# Patient Record
Sex: Female | Born: 1955 | Race: White | Hispanic: No | Marital: Single | Smoking: Former smoker
Health system: Southern US, Community
[De-identification: ages and names within clinical notes are randomized; demographics above are authoritative.]

## PROBLEM LIST (undated history)

## (undated) DIAGNOSIS — F329 Major depressive disorder, single episode, unspecified: Secondary | ICD-10-CM

## (undated) DIAGNOSIS — I959 Hypotension, unspecified: Secondary | ICD-10-CM

## (undated) DIAGNOSIS — J069 Acute upper respiratory infection, unspecified: Secondary | ICD-10-CM

## (undated) DIAGNOSIS — L089 Local infection of the skin and subcutaneous tissue, unspecified: Secondary | ICD-10-CM

## (undated) DIAGNOSIS — F419 Anxiety disorder, unspecified: Secondary | ICD-10-CM

## (undated) DIAGNOSIS — J45909 Unspecified asthma, uncomplicated: Secondary | ICD-10-CM

## (undated) DIAGNOSIS — R001 Bradycardia, unspecified: Secondary | ICD-10-CM

## (undated) DIAGNOSIS — M1990 Unspecified osteoarthritis, unspecified site: Secondary | ICD-10-CM

## (undated) DIAGNOSIS — J329 Chronic sinusitis, unspecified: Secondary | ICD-10-CM

## (undated) DIAGNOSIS — K429 Umbilical hernia without obstruction or gangrene: Secondary | ICD-10-CM

## (undated) DIAGNOSIS — J449 Chronic obstructive pulmonary disease, unspecified: Secondary | ICD-10-CM

## (undated) DIAGNOSIS — J209 Acute bronchitis, unspecified: Secondary | ICD-10-CM

## (undated) DIAGNOSIS — L919 Hypertrophic disorder of the skin, unspecified: Secondary | ICD-10-CM

## (undated) DIAGNOSIS — N209 Urinary calculus, unspecified: Secondary | ICD-10-CM

## (undated) DIAGNOSIS — L909 Atrophic disorder of skin, unspecified: Secondary | ICD-10-CM

## (undated) DIAGNOSIS — M549 Dorsalgia, unspecified: Secondary | ICD-10-CM

## (undated) DIAGNOSIS — F3289 Other specified depressive episodes: Secondary | ICD-10-CM

## (undated) DIAGNOSIS — J4489 Other specified chronic obstructive pulmonary disease: Secondary | ICD-10-CM

## (undated) DIAGNOSIS — E785 Hyperlipidemia, unspecified: Secondary | ICD-10-CM

## (undated) DIAGNOSIS — Z131 Encounter for screening for diabetes mellitus: Secondary | ICD-10-CM

## (undated) HISTORY — PX: CHOLECYSTECTOMY: SHX55

## (undated) HISTORY — DX: Hypertrophic disorder of the skin, unspecified: L91.9

## (undated) HISTORY — DX: Acute bronchitis, unspecified: J20.9

## (undated) HISTORY — DX: Acute upper respiratory infection, unspecified: J06.9

## (undated) HISTORY — DX: Dorsalgia, unspecified: M54.9

## (undated) HISTORY — DX: Major depressive disorder, single episode, unspecified: F32.9

## (undated) HISTORY — DX: Other specified depressive episodes: F32.89

## (undated) HISTORY — PX: HERNIA REPAIR: SHX51

## (undated) HISTORY — DX: Encounter for screening for diabetes mellitus: Z13.1

## (undated) HISTORY — DX: Other specified chronic obstructive pulmonary disease: J44.89

## (undated) HISTORY — DX: Hyperlipidemia, unspecified: E78.5

## (undated) HISTORY — PX: ROTATOR CUFF REPAIR: SHX139

## (undated) HISTORY — DX: Umbilical hernia without obstruction or gangrene: K42.9

## (undated) HISTORY — DX: Local infection of the skin and subcutaneous tissue, unspecified: L08.9

## (undated) HISTORY — DX: Chronic sinusitis, unspecified: J32.9

## (undated) HISTORY — DX: Bradycardia, unspecified: R00.1

## (undated) HISTORY — DX: Hypotension, unspecified: I95.9

## (undated) HISTORY — DX: Atrophic disorder of skin, unspecified: L90.9

## (undated) HISTORY — PX: BACK SURGERY: SHX140

## (undated) HISTORY — DX: Urinary calculus, unspecified: N20.9

## (undated) HISTORY — DX: Chronic obstructive pulmonary disease, unspecified: J44.9

## (undated) HISTORY — DX: Unspecified osteoarthritis, unspecified site: M19.90

---

## 2003-05-14 ENCOUNTER — Emergency Department (HOSPITAL_COMMUNITY): Admission: EM | Admit: 2003-05-14 | Discharge: 2003-05-14 | Payer: Self-pay | Admitting: *Deleted

## 2006-09-29 ENCOUNTER — Emergency Department: Payer: Self-pay | Admitting: Emergency Medicine

## 2006-09-30 ENCOUNTER — Other Ambulatory Visit: Payer: Self-pay

## 2007-12-04 ENCOUNTER — Emergency Department: Payer: Self-pay | Admitting: Emergency Medicine

## 2007-12-30 ENCOUNTER — Emergency Department: Payer: Self-pay | Admitting: Emergency Medicine

## 2007-12-30 ENCOUNTER — Other Ambulatory Visit: Payer: Self-pay

## 2008-08-03 ENCOUNTER — Emergency Department: Payer: Self-pay | Admitting: Emergency Medicine

## 2008-11-10 ENCOUNTER — Emergency Department: Payer: Self-pay | Admitting: Emergency Medicine

## 2008-12-01 ENCOUNTER — Ambulatory Visit: Payer: Self-pay | Admitting: Nurse Practitioner

## 2009-03-15 ENCOUNTER — Ambulatory Visit: Payer: Self-pay | Admitting: Nurse Practitioner

## 2009-03-19 ENCOUNTER — Ambulatory Visit: Payer: Self-pay

## 2009-07-10 ENCOUNTER — Ambulatory Visit: Payer: Self-pay | Admitting: Family Medicine

## 2009-07-10 ENCOUNTER — Ambulatory Visit: Payer: Self-pay | Admitting: General Surgery

## 2009-07-21 ENCOUNTER — Emergency Department: Payer: Self-pay | Admitting: Emergency Medicine

## 2009-08-06 ENCOUNTER — Inpatient Hospital Stay: Payer: Self-pay | Admitting: General Surgery

## 2009-09-18 ENCOUNTER — Ambulatory Visit: Payer: Self-pay | Admitting: Unknown Physician Specialty

## 2009-09-20 ENCOUNTER — Ambulatory Visit: Payer: Self-pay | Admitting: Cardiovascular Disease

## 2009-09-20 HISTORY — PX: CARDIAC CATHETERIZATION: SHX172

## 2009-10-19 ENCOUNTER — Ambulatory Visit: Payer: Self-pay | Admitting: Pain Medicine

## 2009-11-06 ENCOUNTER — Ambulatory Visit: Payer: Self-pay | Admitting: Pain Medicine

## 2009-11-19 ENCOUNTER — Ambulatory Visit: Payer: Self-pay | Admitting: Pain Medicine

## 2009-12-17 ENCOUNTER — Ambulatory Visit: Payer: Self-pay | Admitting: Pain Medicine

## 2010-01-25 ENCOUNTER — Ambulatory Visit: Payer: Self-pay | Admitting: Unknown Physician Specialty

## 2010-02-14 ENCOUNTER — Inpatient Hospital Stay: Payer: Self-pay | Admitting: Unknown Physician Specialty

## 2012-09-07 ENCOUNTER — Ambulatory Visit: Payer: Self-pay | Admitting: Cardiovascular Disease

## 2012-09-20 ENCOUNTER — Encounter: Payer: Self-pay | Admitting: Cardiovascular Disease

## 2012-09-20 ENCOUNTER — Ambulatory Visit (INDEPENDENT_AMBULATORY_CARE_PROVIDER_SITE_OTHER): Payer: Medicaid Other | Admitting: Cardiovascular Disease

## 2012-09-20 VITALS — BP 125/84 | HR 72 | Ht 62.0 in | Wt 261.5 lb

## 2012-09-20 DIAGNOSIS — Z87891 Personal history of nicotine dependence: Secondary | ICD-10-CM | POA: Insufficient documentation

## 2012-09-20 DIAGNOSIS — R001 Bradycardia, unspecified: Secondary | ICD-10-CM

## 2012-09-20 DIAGNOSIS — R0602 Shortness of breath: Secondary | ICD-10-CM

## 2012-09-20 DIAGNOSIS — I498 Other specified cardiac arrhythmias: Secondary | ICD-10-CM

## 2012-09-20 DIAGNOSIS — R42 Dizziness and giddiness: Secondary | ICD-10-CM | POA: Insufficient documentation

## 2012-09-20 NOTE — Progress Notes (Signed)
Patient ID: Angelica Zhang, female    DOB: 08/07/1956, 57 y.o.   MRN: [id number]  HPI Comments: Angelica Zhang is a very pleasant 57 year old woman with long history of smoking who quit 5 months ago, prior cardiac catheterization February 2011 showing no significant CAD, history of back surgery/spinal fusion, long history of bradycardia who presents for low heart rate, dizziness.   She reports that for the past 2 years,She has had a low heart rate. After her spinal surgery, she had heart rates in the 50s. She does report having some dizziness when she turns her head too quickly, also when she turns her body too quickly. No dizziness with going from a sitting to standing position or when bending over.  She denies any near syncope or syncope, only dizziness with movements of her head and upper body.  She is proud that she's been able to quit smoking for the past 5 months.she's had difficulty with weight gain.  Prior cardiac catheterization every 2011 showing no significant CAD, ejection fraction 60%  EKG shows normal sinus rhythm with rate 72 beats per minute with no significant ST or T wave changes EKG in December 2010 showing sinus bradycardia with rate 55 beats a minute with no significant ST or T wave changes Leg ultrasound December 2010 showing no evidence of DVT   Outpatient Encounter Prescriptions as of 09/20/2012  Medication Sig Dispense Refill  . albuterol (PROVENTIL HFA;VENTOLIN HFA) 108 (90 BASE) MCG/ACT inhaler Inhale 2 puffs into the lungs every 4 (four) hours as needed.      . cetirizine (ZYRTEC) 10 MG tablet Take 10 mg by mouth daily.      . clonazePAM (KLONOPIN) 1 MG tablet Take 1 mg by mouth 3 (three) times daily as needed for anxiety.      . Fluticasone-Salmeterol (ADVAIR) 250-50 MCG/DOSE AEPB Inhale 1 puff into the lungs every 12 (twelve) hours.      . simvastatin (ZOCOR) 20 MG tablet Take 20 mg by mouth every evening.        Review of Systems  Constitutional: Negative.   HENT:  Negative.   Eyes: Negative.   Respiratory: Negative.   Cardiovascular: Negative.   Gastrointestinal: Negative.   Musculoskeletal: Negative.   Skin: Negative.   Neurological: Positive for dizziness.  Psychiatric/Behavioral: Negative.   All other systems reviewed and are negative.    BP 125/84  Pulse 72  Ht 5\' 2"  (1.575 m)  Wt 261 lb 8 oz (118.616 kg)  BMI 47.82 kg/m2  Physical Exam  Nursing note and vitals reviewed. Constitutional: She is oriented to person, place, and time. She appears well-developed and well-nourished.  obese  HENT:  Head: Normocephalic.  Nose: Nose normal.  Mouth/Throat: Oropharynx is clear and moist.  Eyes: Conjunctivae are normal. Pupils are equal, round, and reactive to light.  Neck: Normal range of motion. Neck supple. No JVD present.  Cardiovascular: Normal rate, regular rhythm, S1 normal, S2 normal, normal heart sounds and intact distal pulses.  Exam reveals no gallop and no friction rub.   No murmur heard. Pulmonary/Chest: Effort normal and breath sounds normal. No respiratory distress. She has no wheezes. She has no rales. She exhibits no tenderness.  Abdominal: Soft. Bowel sounds are normal. She exhibits no distension. There is no tenderness.  Musculoskeletal: Normal range of motion. She exhibits no edema and no tenderness.  Lymphadenopathy:    She has no cervical adenopathy.  Neurological: She is alert and oriented to person, place, and time. Coordination normal.  Skin: Skin is warm and dry. No rash noted. No erythema.  Psychiatric: She has a normal mood and affect. Her behavior is normal. Judgment and thought content normal.    Assessment and Plan

## 2012-09-20 NOTE — Assessment & Plan Note (Signed)
Based on bradycardia at rest. In the office today rate is in the 60s to 70s. No significant symptoms concerning for symptomatic bradycardia requiring a pacemaker. I suggested he closely monitor her at this time though no further testing needed. If she has syncope or near syncope, Holter monitor could be performed.

## 2012-09-20 NOTE — Assessment & Plan Note (Signed)
We have encouraged continued exercise, careful diet management in an effort to lose weight.She has recently started walking with her daughter

## 2012-09-20 NOTE — Assessment & Plan Note (Signed)
Symptoms consistent with vestibular issue. Currently she is fighting a sinus infection. By her history, she has had frequent sinus infections in the past. Less likely secondary to bradycardia.

## 2012-09-20 NOTE — Patient Instructions (Addendum)
You are doing well. No medication changes were made.  Please call us if you have new issues that need to be addressed before your next appt.    

## 2012-09-20 NOTE — Assessment & Plan Note (Signed)
Encouraged continued use of electronic cigarette to avoid smoking.

## 2014-07-29 ENCOUNTER — Emergency Department: Payer: Self-pay | Admitting: Emergency Medicine

## 2014-08-01 LAB — BETA STREP CULTURE(ARMC)

## 2015-05-12 ENCOUNTER — Emergency Department: Payer: Medicaid Other

## 2015-05-12 ENCOUNTER — Emergency Department
Admission: EM | Admit: 2015-05-12 | Discharge: 2015-05-12 | Disposition: A | Discharge status: Home / Self Care | Payer: Medicaid Other | Attending: Emergency Medicine | Admitting: Emergency Medicine

## 2015-05-12 ENCOUNTER — Encounter: Payer: Self-pay | Admitting: Emergency Medicine

## 2015-05-12 DIAGNOSIS — Z79899 Other long term (current) drug therapy: Secondary | ICD-10-CM | POA: Insufficient documentation

## 2015-05-12 DIAGNOSIS — J440 Chronic obstructive pulmonary disease with acute lower respiratory infection: Secondary | ICD-10-CM

## 2015-05-12 DIAGNOSIS — J441 Chronic obstructive pulmonary disease with (acute) exacerbation: Secondary | ICD-10-CM | POA: Diagnosis not present

## 2015-05-12 DIAGNOSIS — Z7951 Long term (current) use of inhaled steroids: Secondary | ICD-10-CM | POA: Diagnosis not present

## 2015-05-12 DIAGNOSIS — I10 Essential (primary) hypertension: Secondary | ICD-10-CM | POA: Insufficient documentation

## 2015-05-12 DIAGNOSIS — Z87891 Personal history of nicotine dependence: Secondary | ICD-10-CM | POA: Diagnosis not present

## 2015-05-12 DIAGNOSIS — J209 Acute bronchitis, unspecified: Secondary | ICD-10-CM

## 2015-05-12 DIAGNOSIS — R0602 Shortness of breath: Secondary | ICD-10-CM | POA: Diagnosis present

## 2015-05-12 LAB — CBC WITH DIFFERENTIAL/PLATELET
Basophils Absolute: 0.1 10*3/uL (ref 0–0.1)
Basophils Relative: 1 %
Eosinophils Absolute: 0 10*3/uL (ref 0–0.7)
Eosinophils Relative: 0 %
HEMATOCRIT: 45.3 % (ref 35.0–47.0)
Hemoglobin: 15.3 g/dL (ref 12.0–16.0)
LYMPHS ABS: 1.5 10*3/uL (ref 1.0–3.6)
LYMPHS PCT: 19 %
MCH: 31.3 pg (ref 26.0–34.0)
MCHC: 33.8 g/dL (ref 32.0–36.0)
MCV: 92.7 fL (ref 80.0–100.0)
MONO ABS: 0.2 10*3/uL (ref 0.2–0.9)
MONOS PCT: 3 %
NEUTROS ABS: 6.2 10*3/uL (ref 1.4–6.5)
Neutrophils Relative %: 77 %
Platelets: 153 10*3/uL (ref 150–440)
RBC: 4.88 MIL/uL (ref 3.80–5.20)
RDW: 13 % (ref 11.5–14.5)
WBC: 8 10*3/uL (ref 3.6–11.0)

## 2015-05-12 LAB — COMPREHENSIVE METABOLIC PANEL
ALBUMIN: 3.9 g/dL (ref 3.5–5.0)
ALT: 37 U/L (ref 14–54)
ANION GAP: 13 (ref 5–15)
AST: 50 U/L — ABNORMAL HIGH (ref 15–41)
Alkaline Phosphatase: 74 U/L (ref 38–126)
BUN: 14 mg/dL (ref 6–20)
CO2: 25 mmol/L (ref 22–32)
Calcium: 8.9 mg/dL (ref 8.9–10.3)
Chloride: 100 mmol/L — ABNORMAL LOW (ref 101–111)
Creatinine, Ser: 0.8 mg/dL (ref 0.44–1.00)
GFR calc Af Amer: >60 mL/min (ref >60)
GFR calc non Af Amer: >60 mL/min (ref >60)
GLUCOSE: 250 mg/dL — AB (ref 65–99)
POTASSIUM: 4.1 mmol/L (ref 3.5–5.1)
SODIUM: 138 mmol/L (ref 135–145)
Total Bilirubin: 0.7 mg/dL (ref 0.3–1.2)
Total Protein: 7.2 g/dL (ref 6.5–8.1)

## 2015-05-12 LAB — LACTIC ACID, PLASMA: Lactic Acid, Venous: 3.7 mmol/L (ref 0.5–2.0)

## 2015-05-12 LAB — URINALYSIS COMPLETE WITH MICROSCOPIC (ARMC ONLY)
BACTERIA UA: NONE SEEN
Bilirubin Urine: NEGATIVE
Glucose, UA: 50 mg/dL — AB
Hgb urine dipstick: NEGATIVE
Ketones, ur: NEGATIVE mg/dL
Leukocytes, UA: NEGATIVE
NITRITE: NEGATIVE
PROTEIN: NEGATIVE mg/dL
SPECIFIC GRAVITY, URINE: 1.012 (ref 1.005–1.030)
pH: 6 (ref 5.0–8.0)

## 2015-05-12 LAB — TROPONIN I: Troponin I: <0.03 ng/mL (ref <0.031)

## 2015-05-12 LAB — RAPID INFLUENZA A&B ANTIGENS (ARMC ONLY): INFLUENZA B (ARMC): NEGATIVE

## 2015-05-12 LAB — FIBRIN DERIVATIVES D-DIMER (ARMC ONLY): FIBRIN DERIVATIVES D-DIMER (ARMC): 299 (ref 0–499)

## 2015-05-12 LAB — RAPID INFLUENZA A&B ANTIGENS: Influenza A (ARMC): NEGATIVE

## 2015-05-12 MED ORDER — PREDNISONE 20 MG PO TABS
60.0000 mg | ORAL_TABLET | Freq: Once | ORAL | Status: AC
  Administered 2015-05-12: 60 mg via ORAL
  Filled 2015-05-12: qty 3

## 2015-05-12 MED ORDER — IPRATROPIUM-ALBUTEROL 0.5-2.5 (3) MG/3ML IN SOLN
3.0000 mL | Freq: Once | RESPIRATORY_TRACT | Status: AC
  Administered 2015-05-12: 3 mL via RESPIRATORY_TRACT
  Filled 2015-05-12: qty 3

## 2015-05-12 MED ORDER — NICOTINE 21 MG/24HR TD PT24: 21.0000 mg | MEDICATED_PATCH | Freq: Every day | TRANSDERMAL | Status: DC

## 2015-05-12 MED ORDER — PREDNISONE 20 MG PO TABS
40.0000 mg | ORAL_TABLET | Freq: Every day | ORAL | Status: AC
Start: 2015-05-13 — End: 2016-05-17

## 2015-05-12 NOTE — ED Notes (Signed)
MD at bedside to assess patient.

## 2015-05-12 NOTE — Discharge Instructions (Signed)
Chronic Obstructive Pulmonary Disease  Chronic obstructive pulmonary disease (COPD) is a common lung condition in which airflow from the lungs is limited. COPD is a general term that can be used to describe many different lung problems that limit airflow, including both chronic bronchitis and emphysema. If you have COPD, your lung function will probably never return to normal, but there are measures you can take to improve lung function and make yourself feel better.   CAUSES    Smoking (common).    Exposure to secondhand smoke.    Genetic problems.   Chronic inflammatory lung diseases or recurrent infections.  SYMPTOMS    Shortness of breath, especially with physical activity.    Deep, persistent (chronic) cough with a large amount of thick mucus.    Wheezing.    Rapid breaths (tachypnea).    Gray or bluish discoloration (cyanosis) of the skin, especially in fingers, toes, or lips.    Fatigue.    Weight loss.    Frequent infections or episodes when breathing symptoms become much worse (exacerbations).    Chest tightness.  DIAGNOSIS   Your health care provider will take a medical history and perform a physical examination to make the initial diagnosis. Additional tests for COPD may include:    Lung (pulmonary) function tests.   Chest X-ray.   CT scan.   Blood tests.  TREATMENT   Treatment available to help you feel better when you have COPD includes:    Inhaler and nebulizer medicines. These help manage the symptoms of COPD and make your breathing more comfortable.   Supplemental oxygen. Supplemental oxygen is only helpful if you have a low oxygen level in your blood.    Exercise and physical activity. These are beneficial for nearly all people with COPD. Some people may also benefit from a pulmonary rehabilitation program.  HOME CARE INSTRUCTIONS    Take all medicines (inhaled or pills) as directed by your health care provider.   Avoid over-the-counter medicines or cough syrups  that dry up your airway (such as antihistamines) and slow down the elimination of secretions unless instructed otherwise by your health care provider.    If you are a smoker, the most important thing that you can do is stop smoking. Continuing to smoke will cause further lung damage and breathing trouble. Ask your health care provider for help with quitting smoking. He or she can direct you to community resources or hospitals that provide support.   Avoid exposure to irritants such as smoke, chemicals, and fumes that aggravate your breathing.   Use oxygen therapy and pulmonary rehabilitation if directed by your health care provider. If you require home oxygen therapy, ask your health care provider whether you should purchase a pulse oximeter to measure your oxygen level at home.    Avoid contact with individuals who have a contagious illness.   Avoid extreme temperature and humidity changes.   Eat healthy foods. Eating smaller, more frequent meals and resting before meals may help you maintain your strength.   Stay active, but balance activity with periods of rest. Exercise and physical activity will help you maintain your ability to do things you want to do.   Preventing infection and hospitalization is very important when you have COPD. Make sure to receive all the vaccines your health care provider recommends, especially the pneumococcal and influenza vaccines. Ask your health care provider whether you need a pneumonia vaccine.   Learn and use relaxation techniques to manage stress.   Learn   going to whistle and breathe out (exhale) through the pursed lips for 2 seconds.   Diaphragmatic breathing. Start by putting one hand on your abdomen just above  your waist. Inhale slowly through your nose. The hand on your abdomen should move out. Then purse your lips and exhale slowly. You should be able to feel the hand on your abdomen moving in as you exhale.   Learn and use controlled coughing to clear mucus from your lungs. Controlled coughing is a series of short, progressive coughs. The steps of controlled coughing are:  1. Lean your head slightly forward.  2. Breathe in deeply using diaphragmatic breathing.  3. Try to hold your breath for 3 seconds.  4. Keep your mouth slightly open while coughing twice.  5. Spit any mucus out into a tissue.  6. Rest and repeat the steps once or twice as needed. SEEK MEDICAL CARE IF:   You are coughing up more mucus than usual.   There is a change in the color or thickness of your mucus.   Your breathing is more labored than usual.   Your breathing is faster than usual.  SEEK IMMEDIATE MEDICAL CARE IF:   You have shortness of breath while you are resting.   You have shortness of breath that prevents you from:  Being able to talk.   Performing your usual physical activities.   You have chest pain lasting longer than 5 minutes.   Your skin color is more cyanotic than usual.  You measure low oxygen saturations for longer than 5 minutes with a pulse oximeter. MAKE SURE YOU:   Understand these instructions.  Will watch your condition.  Will get help right away if you are not doing well or get worse. Document Released: 05/07/2005 Document Revised: 12/12/2013 Document Reviewed: 03/24/2013 [facility] Patient Information 2015 [place], [place]. This information is not intended to replace advice given to you by your health care provider. Make sure you discuss any questions you have with your health care provider.  Please return immediately if condition worsens. Please contact her primary physician or the physician you were given for referral. If you have any specialist physicians involved  in her treatment and plan please also contact them. Thank you for using [facility]. Please continue your amoxicillin and we're going to boost your prednisone over the next 5 days. Over-the-counter Afrin nasal spray alternating nasal passages. Drink plenty of fluids and continue your home nebulizers as needed. He can give yourself breathing treatments every 2-4 hours as needed. Contact your primary physician on Monday for follow-up as soon as possible.

## 2015-05-12 NOTE — ED Notes (Signed)
Patient transported to X-ray 

## 2015-05-12 NOTE — ED Provider Notes (Signed)
Time Seen: Approximately ----------------------------------------- 5:50 PM on 05/12/2015 -----------------------------------------   I have reviewed the triage notes  Chief Complaint: Shortness of Breath   History of Present Illness: Angelica Zhang is a 59 y.o. female who presents with some progressive shortness of breath over the past month. She states she has seen a PA with her primary physician was prescribed prednisone and amoxicillin. She states she's done with the amoxicillin and takes the prednisone until Monday. She states she doesn't feel any improvement. She's had a lot of sinus drainage with a dry nonproductive cough. She states she's coughed hard enough to vomit but no persistent nausea or vomiting. She denies any loose stool or diarrhea. She is not aware of any high fevers at home and describes it is low grade approximately 99 was seen at the office. She states that she has some chronic peripheral edema but is noticed some slightly more swelling than usual since been on the steroids. She denies any dysuria, hematuria, urinary frequency. She denies any chest pain, arm pain, or jaw pain. She states is been a lot of illness with her grandkids with strep throat, sinus infection, etc.   Past Medical History  Diagnosis Date  . Sinusitis   . Bradycardia   . Urinary calculus, unspecified   . Acute upper respiratory infections of unspecified site   . Backache, unspecified   . Acute bronchitis   . Depressive disorder, not elsewhere classified   . Hypotension, unspecified   . Unspecified local infection of skin and subcutaneous tissue   . Screening for diabetes mellitus   . Unspecified hypertrophic and atrophic condition of skin   . Osteoarthrosis, unspecified whether generalized or localized, other specified sites   . Umbilical hernia without mention of obstruction or gangrene   . Chronic airway obstruction, not elsewhere classified   . Other and unspecified hyperlipidemia      Patient Active Problem List   Diagnosis Date Noted  . Bradycardia 09/20/2012  . Dizziness 09/20/2012  . Morbid obesity 09/20/2012  . Smoking history 09/20/2012    Past Surgical History  Procedure Laterality Date  . Back surgery    . Cardiac catheterization  09/20/2009    [facility];[place]  . Hernia repair      x 2  . Cholecystectomy    . Rotator cuff repair      Past Surgical History  Procedure Laterality Date  . Back surgery    . Cardiac catheterization  09/20/2009    [facility]  . Hernia repair      x 2  . Cholecystectomy    . Rotator cuff repair      Current Outpatient Rx  Name  Route  Sig  Dispense  Refill  . albuterol (PROVENTIL HFA;VENTOLIN HFA) 108 (90 BASE) MCG/ACT inhaler   Inhalation   Inhale 2 puffs into the lungs every 4 (four) hours as needed.         . cetirizine (ZYRTEC) 10 MG tablet   Oral   Take 10 mg by mouth daily.         . clonazePAM (KLONOPIN) 1 MG tablet   Oral   Take 1 mg by mouth 3 (three) times daily as needed for anxiety.         . Fluticasone-Salmeterol (ADVAIR) 250-50 MCG/DOSE AEPB   Inhalation   Inhale 1 puff into the lungs every 12 (twelve) hours.         . simvastatin (ZOCOR) 20 MG tablet   Oral   Take  20 mg by mouth every evening.           Allergies:  Review of patient's allergies indicates no known allergies.  Family History: Family History  Problem Relation Age of Onset  . Heart attack Mother     Social History: Social History  Substance Use Topics  . Smoking status: Former Smoker -- 1.00 packs/day for 40 years    Types: Cigarettes  . Smokeless tobacco: None  . Alcohol Use: No     Review of Systems:   10 point review of systems was performed and was otherwise negative:  Constitutional: No fever Eyes: No visual disturbances ENT: No sore throat, ear pain Cardiac: No chest pain Respiratory: No shortness of breath, wheezing, or stridor Abdomen: No abdominal pain, no vomiting, No  diarrhea Endocrine: No weight loss, No night sweats Extremities: No peripheral edema, cyanosis Skin: No rashes, easy bruising Neurologic: No focal weakness, trouble with speech or swollowing Urologic: No dysuria, Hematuria, or urinary frequency   Physical Exam:  ED Triage Vitals  Enc Vitals Group     BP 05/12/15 1659 115/60 mmHg     Pulse Rate 05/12/15 1659 60     Resp 05/12/15 1659 20     Temp 05/12/15 1659 98 F (36.7 C)     Temp Source 05/12/15 1659 Oral     SpO2 05/12/15 1659 89 %     Weight 05/12/15 1659 262 lb (118.842 kg)     Height 05/12/15 1659 5\' 2"  (1.575 m)     Head Cir --      Peak Flow --      Pain Score --      Pain Loc --      Pain Edu? --      Excl. in [place]? --     General: Awake , Alert , and Oriented times 3; GCS 15 Head: Normal cephalic , atraumatic Eyes: Pupils equal , round, reactive to light Nose/Throat: No nasal drainage, patent upper airway without erythema or exudate.  Neck: Supple, Full range of motion, No anterior adenopathy or palpable thyroid masses Lungs: Lung sounds show minimal air movement at the bases with some end expiratory wheezing heard in both upper lung fields. No rhonchi or rales are noted. Heart: Regular rate, regular rhythm without murmurs , gallops , or rubs Abdomen: Soft, non tender without rebound, guarding , or rigidity; bowel sounds positive and symmetric in all 4 quadrants. No organomegaly .        Extremities: 2 plus symmetric pulses. Mild nonpitting edema, clubbing or cyanosis Neurologic: normal ambulation, Motor symmetric without deficits, sensory intact Skin: warm, dry, no rashes   Labs:   All laboratory work was reviewed including any pertinent negatives or positives listed below:  Labs Reviewed  CULTURE, BLOOD (ROUTINE X 2)  CULTURE, BLOOD (ROUTINE X 2)  URINE CULTURE  INFLUENZA A&B ANTIGENS ([facility] ONLY)  COMPREHENSIVE METABOLIC PANEL  CBC WITH DIFFERENTIAL/PLATELET  LACTIC ACID, PLASMA  LACTIC ACID, PLASMA   URINALYSIS COMPLETEWITH MICROSCOPIC ([facility] ONLY)  FIBRIN DERIVATIVES D-DIMER ([facility] ONLY)  TROPONIN I    EKG: * ED ECG REPORT I, Daymon Larsen, the attending physician, personally viewed and interpreted this ECG.  Date: 05/12/2015 EKG Time: 1709 Rate: 58 Rhythm: normal sinus rhythm QRS Axis: normal Intervals: normal ST/T Wave abnormalities: normal Conduction Disutrbances: none Narrative Interpretation: unremarkable    Radiology:  EXAM: CHEST 2 VIEW  COMPARISON: 02/15/2010  FINDINGS: COPD with hyperinflation and prominent lung markings. Negative for pneumonia or heart  failure. No effusion.  IMPRESSION: COPD without acute abnormality.    I personally reviewed the radiologic studies     ED Course: Patient received 2 DuoNeb here in emergency department and was reexamined. Her pulse oximetry stabilizes anywhere from 91-94% on room air. He does have a home nebulization therapy available and she's been advised to continue that every 2-4 hours. He was advised to close out the amoxicillin and I boosted her prednisone over the next 5 days. She most likely has some form of acute bronchitis with bronchospasm. There is no evidence clinically or on x-ray evaluation of community-acquired pneumonia at this point but advised her that the amoxicillin was not hurting her at this point and close out the prescription. She's been advised to use over-the-counter Afrin nasal spray on occasion for her nasal congestion and to proceed with easier tech that was recommended by the PA for seasonal allergies. The bronchitis is most likely allergy related based on the length of time, she is afebrile, and testing here was negative for influenza and bacterial causes.   Assessment Acute bronchitis with bronchospasm Tobacco abuse COPD     Plan:  Patient was advised to return immediately if condition worsens. Patient was advised to follow up with her primary care physician or other specialized  physicians involved and in their current assessment.             Daymon Larsen, MD 05/12/15 2255

## 2015-05-12 NOTE — ED Notes (Signed)
Patient states her symptoms started >1 month ago. Went to her primary care doctor and was put on prednisone and amoxicillin. Patient states her symptoms have gradually worsened over the past week.

## 2015-05-14 LAB — URINE CULTURE

## 2015-05-17 LAB — CULTURE, BLOOD (ROUTINE X 2)
CULTURE: NO GROWTH
Culture: NO GROWTH

## 2016-03-19 ENCOUNTER — Emergency Department: Payer: Medicaid Other

## 2016-03-19 ENCOUNTER — Encounter: Payer: Self-pay | Admitting: *Deleted

## 2016-03-19 DIAGNOSIS — Z7952 Long term (current) use of systemic steroids: Secondary | ICD-10-CM | POA: Insufficient documentation

## 2016-03-19 DIAGNOSIS — Z87891 Personal history of nicotine dependence: Secondary | ICD-10-CM | POA: Diagnosis not present

## 2016-03-19 DIAGNOSIS — Z792 Long term (current) use of antibiotics: Secondary | ICD-10-CM | POA: Diagnosis not present

## 2016-03-19 DIAGNOSIS — K5900 Constipation, unspecified: Secondary | ICD-10-CM | POA: Insufficient documentation

## 2016-03-19 DIAGNOSIS — Z79899 Other long term (current) drug therapy: Secondary | ICD-10-CM | POA: Insufficient documentation

## 2016-03-19 DIAGNOSIS — R1084 Generalized abdominal pain: Secondary | ICD-10-CM | POA: Diagnosis present

## 2016-03-19 DIAGNOSIS — R0602 Shortness of breath: Secondary | ICD-10-CM | POA: Diagnosis not present

## 2016-03-19 LAB — BASIC METABOLIC PANEL
ANION GAP: 7 (ref 5–15)
BUN: 12 mg/dL (ref 6–20)
CHLORIDE: 107 mmol/L (ref 101–111)
CO2: 26 mmol/L (ref 22–32)
Calcium: 9.4 mg/dL (ref 8.9–10.3)
Creatinine, Ser: 0.76 mg/dL (ref 0.44–1.00)
GFR calc Af Amer: >60 mL/min (ref >60)
Glucose, Bld: 91 mg/dL (ref 65–99)
POTASSIUM: 4 mmol/L (ref 3.5–5.1)
SODIUM: 140 mmol/L (ref 135–145)

## 2016-03-19 LAB — CBC
HEMATOCRIT: 45.3 % (ref 35.0–47.0)
HEMOGLOBIN: 15.7 g/dL (ref 12.0–16.0)
MCH: 31.9 pg (ref 26.0–34.0)
MCHC: 34.7 g/dL (ref 32.0–36.0)
MCV: 91.8 fL (ref 80.0–100.0)
Platelets: 182 10*3/uL (ref 150–440)
RBC: 4.93 MIL/uL (ref 3.80–5.20)
RDW: 13 % (ref 11.5–14.5)
WBC: 9.2 10*3/uL (ref 3.6–11.0)

## 2016-03-19 LAB — TROPONIN I: Troponin I: <0.03 ng/mL (ref <0.03)

## 2016-03-19 MED ORDER — ALBUTEROL SULFATE (2.5 MG/3ML) 0.083% IN NEBU: 5.0000 mg | INHALATION_SOLUTION | Freq: Once | RESPIRATORY_TRACT | Status: DC

## 2016-03-19 NOTE — ED Triage Notes (Addendum)
Pt reports she has sob for 2 days.  Pt reports tingling in chest area.  No n/v/d.  Pt reports feeling constipated.  Last bm was today.  Hx copd.   Pt alert.  Speech clear.   Pt drinking water in triage.

## 2016-03-20 ENCOUNTER — Emergency Department
Admission: EM | Admit: 2016-03-20 | Discharge: 2016-03-20 | Disposition: A | Discharge status: Home / Self Care | Payer: Medicaid Other | Attending: Emergency Medicine | Admitting: Emergency Medicine

## 2016-03-20 ENCOUNTER — Emergency Department: Payer: Medicaid Other

## 2016-03-20 DIAGNOSIS — R1084 Generalized abdominal pain: Secondary | ICD-10-CM

## 2016-03-20 DIAGNOSIS — K5900 Constipation, unspecified: Secondary | ICD-10-CM

## 2016-03-20 LAB — URINALYSIS COMPLETE WITH MICROSCOPIC (ARMC ONLY)
BILIRUBIN URINE: NEGATIVE
Glucose, UA: NEGATIVE mg/dL
Hgb urine dipstick: NEGATIVE
KETONES UR: NEGATIVE mg/dL
LEUKOCYTES UA: NEGATIVE
NITRITE: NEGATIVE
PH: 5 (ref 5.0–8.0)
Protein, ur: NEGATIVE mg/dL
RBC / HPF: NONE SEEN RBC/hpf (ref 0–5)
SPECIFIC GRAVITY, URINE: 1.012 (ref 1.005–1.030)
WBC, UA: NONE SEEN WBC/hpf (ref 0–5)

## 2016-03-20 MED ORDER — IOPAMIDOL (ISOVUE-300) INJECTION 61%
100.0000 mL | Freq: Once | INTRAVENOUS | Status: AC | PRN
  Administered 2016-03-20: 100 mL via INTRAVENOUS

## 2016-03-20 MED ORDER — DIATRIZOATE MEGLUMINE & SODIUM 66-10 % PO SOLN
15.0000 mL | Freq: Once | ORAL | Status: AC
  Administered 2016-03-20: 15 mL via ORAL

## 2016-03-20 NOTE — ED Notes (Signed)
Discharge instructions reviewed with patient. Patient verbalized understanding. Patient ambulated to lobby without difficulty.   

## 2016-03-20 NOTE — ED Provider Notes (Signed)
[facility] Emergency Department Provider Note  ____________________________________________   First MD Initiated Contact with Patient 03/20/16 [phone]     (approximate)  I have reviewed the triage vital signs and the nursing notes.   HISTORY  Chief Complaint Shortness of Breath    HPI Angelica Zhang is a 60 y.o. female who presented to triage with complaints of shortness of breath 2 days and "tingling" in the chest area.  However for me she states that she did feel slightly short of breath yesterday but that is completely resolved that she wants to discuss the generalized abdominal pain that she has had for several weeks.  She reports that she has lost 40 pounds over the last several months and that she stop smoking.  Since then, however, she has found that her stools are very thin and that she goes less frequently.  She did have a normal bowel movement today.  She reports mild generalized abdominal pain that comes and goes.  Nothing in particular makes it better nor worse.  She denies vomiting does have some occasional nausea.  She is concerned because her father died of liver disease and he was not an alcoholic.  She denies blood in her stool.  She denies fever/chills, chest pain, dysuria.   Past Medical History:  Diagnosis Date  . Acute bronchitis   . Acute upper respiratory infections of unspecified site   . Backache, unspecified   . Bradycardia   . Chronic airway obstruction, not elsewhere classified   . Depressive disorder, not elsewhere classified   . Hypotension, unspecified   . Osteoarthrosis, unspecified whether generalized or localized, other specified sites   . Other and unspecified hyperlipidemia   . Screening for diabetes mellitus   . Sinusitis   . Umbilical hernia without mention of obstruction or gangrene   . Unspecified hypertrophic and atrophic condition of skin   . Unspecified local infection of skin and subcutaneous tissue   . Urinary  calculus, unspecified     Patient Active Problem List   Diagnosis Date Noted  . Bradycardia 09/20/2012  . Dizziness 09/20/2012  . Morbid obesity ([place]) 09/20/2012  . Smoking history 09/20/2012    Past Surgical History:  Procedure Laterality Date  . BACK SURGERY    . CARDIAC CATHETERIZATION  09/20/2009   [facility];[place]  . CHOLECYSTECTOMY    . HERNIA REPAIR     x 2  . ROTATOR CUFF REPAIR      Prior to Admission medications   Medication Sig Start Date End Date Taking? Authorizing Provider  ADVAIR HFA 230-21 MCG/ACT inhaler Inhale 2 puffs into the lungs 2 (two) times daily. 04/12/15   Historical Provider, MD  albuterol (PROVENTIL HFA;VENTOLIN HFA) 108 (90 BASE) MCG/ACT inhaler Inhale 2 puffs into the lungs every 4 (four) hours as needed for wheezing or shortness of breath.     Historical Provider, MD  albuterol (PROVENTIL) (2.5 MG/3ML) 0.083% nebulizer solution Take 3 mLs by nebulization every 4 (four) hours as needed. For shortness of breath and wheezing. 04/12/15   Historical Provider, MD  ALPRAZolam Duanne Moron) 1 MG tablet Take 1 mg by mouth 3 (three) times daily as needed. For anxiety 03/17/15   Historical Provider, MD  amoxicillin (AMOXIL) 875 MG tablet Take 875 mg by mouth 2 (two) times daily. for 10 days 05/07/15   Historical Provider, MD  ATROVENT HFA 17 MCG/ACT inhaler Inhale 2 puffs into the lungs 4 (four) times daily. For COPD. 04/12/15   Historical Provider, MD  Fluticasone-Salmeterol (ADVAIR) 250-50 MCG/DOSE AEPB Inhale 1 puff into the lungs every 12 (twelve) hours.    Historical Provider, MD  montelukast (SINGULAIR) 10 MG tablet Take 10 mg by mouth at bedtime. 03/17/15   Historical Provider, MD  nicotine (NICODERM CQ - DOSED IN MG/24 HOURS) 21 mg/24hr patch Place 1 patch (21 mg total) onto the skin daily. 05/12/15   Daymon Larsen, MD  predniSONE (DELTASONE) 20 MG tablet Take 2 tablets (40 mg total) by mouth daily. 05/13/15 05/17/16  Daymon Larsen, MD    Allergies Review of patient's  allergies indicates no known allergies.  Family History  Problem Relation Age of Onset  . Heart attack Mother     Social History Social History  Substance Use Topics  . Smoking status: Former Smoker    Packs/day: 1.00    Years: 40.00    Types: Cigarettes  . Smokeless tobacco: Not on file  . Alcohol use No    Review of Systems Constitutional: No fever/chills Eyes: No visual changes. ENT: No sore throat. Cardiovascular: Denies chest pain. Respiratory: shortness of breath As today.  Did not use her albuterol. Gastrointestinal: Intermittent generalized abdominal pain.  nausea, no vomiting.  No diarrhea.  +constipation. Genitourinary: Negative for dysuria. Musculoskeletal: Negative for back pain. Skin: Negative for rash. Neurological: Negative for headaches, focal weakness or numbness.  10-point ROS otherwise negative.  ____________________________________________   PHYSICAL EXAM:  VITAL SIGNS: ED Triage Vitals  Enc Vitals Group     BP 03/19/16 2202 112/84     Pulse Rate 03/19/16 2202 (!) 50     Resp 03/19/16 2202 20     Temp 03/19/16 2202 98 F (36.7 C)     Temp src --      SpO2 03/19/16 2202 96 %     Weight 03/19/16 2202 230 lb (104.3 kg)     Height 03/19/16 2202 5\' 2"  (1.575 m)     Head Circumference --      Peak Flow --      Pain Score 03/19/16 2204 2     Pain Loc --      Pain Edu? --      Excl. in [place]? --     Constitutional: Alert and oriented. Well appearing and in no acute distress. Eyes: Conjunctivae are normal. PERRL. EOMI. Head: Atraumatic. Nose: No congestion/rhinnorhea. Mouth/Throat: Mucous membranes are moist.  Oropharynx non-erythematous. Neck: No stridor.  No meningeal signs.   Cardiovascular: Normal rate, regular rhythm. Good peripheral circulation. Grossly normal heart sounds.   Respiratory: Normal respiratory effort.  No retractions. Lungs CTAB. Gastrointestinal: Soft, Obese, mild diffuse abdominal tenderness throughout.   Musculoskeletal:  No lower extremity tenderness nor edema. No gross deformities of extremities. Neurologic:  Normal speech and language. No gross focal neurologic deficits are appreciated.  Skin:  Skin is warm, dry and intact. No rash noted. Psychiatric: Mood and affect are normal. Speech and behavior are normal.  ____________________________________________   LABS (all labs ordered are listed, but only abnormal results are displayed)  Labs Reviewed  URINALYSIS COMPLETEWITH MICROSCOPIC ([facility] ONLY) - Abnormal; Notable for the following:       Result Value   Color, Urine YELLOW (*)    APPearance CLEAR (*)    Bacteria, UA RARE (*)    Squamous Epithelial / LPF 0-5 (*)    All other components within normal limits  BASIC METABOLIC PANEL  CBC  TROPONIN I   ____________________________________________  EKG  ED ECG REPORT I, Brelee Renk, the  attending physician, personally viewed and interpreted this ECG.  Date: 03/19/2016 EKG Time: 21:58 Rate: 51 Rhythm: sinus bradycardia QRS Axis: normal Intervals: normal ST/T Wave abnormalities: normal Conduction Disturbances: none Narrative Interpretation: unremarkable  ____________________________________________  RADIOLOGY   Dg Chest 2 View  Result Date: 03/19/2016 CLINICAL DATA:  Pt reports she has sob for 2 days. Pt reports tingling in chest area. No n/v/d. Pt reports feeling constipated. Last BM was today. Hx copd 05/12/2015 EXAM: CHEST  2 VIEW COMPARISON:  05/12/2015 FINDINGS: Lungs are mildly hyperinflated. Heart is normal in size. There are no focal consolidations. No pleural effusions or pulmonary edema. IMPRESSION: No evidence for acute  abnormality. Electronically Signed   By: Nolon Nations M.D.   On: 03/19/2016 22:40   Ct Abdomen Pelvis W Contrast  Result Date: 03/20/2016 CLINICAL DATA:  Acute onset of shortness of breath and chest tingling. Constipation. Initial encounter. EXAM: CT ABDOMEN AND PELVIS WITH CONTRAST TECHNIQUE: Multidetector  CT imaging of the abdomen and pelvis was performed using the standard protocol following bolus administration of intravenous contrast. CONTRAST:  117mL ISOVUE-300 IOPAMIDOL (ISOVUE-300) INJECTION 61% COMPARISON:  CT of the abdomen and pelvis performed 03/19/2009 FINDINGS: The visualized lung bases are clear. The liver and spleen are unremarkable in appearance. The patient is status post cholecystectomy, with clips noted at the gallbladder fossa. The pancreas and adrenal glands are unremarkable. A 2.3 cm left renal cyst is noted. There is no evidence of hydronephrosis. No renal or ureteral stones are seen. No perinephric stranding is appreciated. No free fluid is identified. The small bowel is unremarkable in appearance. The stomach is within normal limits. No acute vascular abnormalities are seen. Minimal calcification is seen along the distal abdominal aorta and its branches. The appendix is normal in caliber, without evidence of appendicitis. The colon is grossly unremarkable in appearance. The bladder is mildly distended and grossly unremarkable. The uterus is unremarkable in appearance. The ovaries are relatively symmetric. No suspicious adnexal masses are seen. No inguinal lymphadenopathy is seen. No acute osseous abnormalities are identified. Lumbosacral spinal fusion hardware is noted at L4-S1, with underlying decompression. There is mild grade 1 anterolisthesis of L4 on L5. IMPRESSION: 1. No acute abnormality seen within the abdomen or pelvis. 2. Small left renal cyst noted. Electronically Signed   By: Garald Balding M.D.   On: 03/20/2016 02:05    ____________________________________________   PROCEDURES  Procedure(s) performed:   Procedures   Critical Care performed: No ____________________________________________   INITIAL IMPRESSION / ASSESSMENT AND PLAN / ED COURSE  Pertinent labs & imaging results that were available during my care of the patient were reviewed by me and considered in  my medical decision making (see chart for details).  Patient's labs are unremarkable and her lungs are clear to auscultation.  She has a history of mild COPD although she has stopped smoking.  Her chest x-ray is reassuring.  I explained to her that based on her history and symptoms I find it very unlikely that she has an acute or emergent medical issue which is very concerned about the diffuse abdominal tenderness.  We will proceed with a CT scan of her abdomen and pelvis but I anticipate outpatient follow-up and possible referral to GI for additional outpatient workup will be appropriate.  Clinical Course  Comment By Time  The CT scan was unremarkable.  Patient is comfortable and no acute distress.  She continues to worry about her constipation I am giving her some outpatient recommendations for medication she  may take that I believe she is appropriate for outpatient follow-up.  In spite of her worry about her abdominal pain she is laughing and joking and in no distress. Hinda Kehr, MD 08/10 [phone]    ____________________________________________  FINAL CLINICAL IMPRESSION(S) / ED DIAGNOSES  Final diagnoses:  Generalized abdominal pain  Constipation, unspecified constipation type     MEDICATIONS GIVEN DURING THIS VISIT:  Medications  diatrizoate meglumine-sodium (GASTROGRAFIN) 66-10 % solution 15 mL (15 mLs Oral Given 03/20/16 0050)  iopamidol (ISOVUE-300) 61 % injection 100 mL (100 mLs Intravenous Contrast Given 03/20/16 0142)     NEW OUTPATIENT MEDICATIONS STARTED DURING THIS VISIT:  New Prescriptions   No medications on file      Note:  This document was prepared using Dragon voice recognition software and may include unintentional dictation errors.    Hinda Kehr, MD 03/20/16 [phone]

## 2016-03-20 NOTE — Discharge Instructions (Signed)
You have been seen in the Emergency Department (ED) for abdominal pain.  Your evaluation did not identify a clear cause of your symptoms but was generally reassuring.  To help with your symptoms of constipation, we recommend that you try one or more of the following:  1)  Colace (or Dulcolax) 100 mg:  This is a stool softener, and you may take it once or twice a day as needed. 2)  Senna tablets:  This is a bowel stimulant that will help "push" out your stool. It is the next step to add after you have tried a stool softener. 3)  Miralax (powder):  This medication works by drawing additional fluid into your intestines and helps to flush out your stool.  Mix the powder with water or juice according to label instructions.  It may help if the Colace and Senna are not sufficient, but you must be sure to use the recommended amount of water or juice when you mix up the powder. Remember that narcotic pain medications are constipating, so avoid them or minimize their use.  Drink plenty of fluids.  Please follow up as instructed above regarding today?s emergent visit and the symptoms that are bothering you.  Return to the ED if your abdominal pain worsens or fails to improve, you develop bloody vomiting, bloody diarrhea, you are unable to tolerate fluids due to vomiting, fever greater than 101, or other symptoms that concern you.

## 2016-03-20 NOTE — ED Notes (Signed)
CT called to come get patient, she has finished her oral contrast.

## 2016-08-26 ENCOUNTER — Ambulatory Visit
Admission: RE | Admit: 2016-08-26 | Discharge: 2016-08-26 | Disposition: A | Discharge status: Home / Self Care | Payer: Medicaid Other | Source: Ambulatory Visit | Attending: Cardiology | Admitting: Cardiology

## 2016-08-26 ENCOUNTER — Encounter
Admission: RE | Admit: 2016-08-26 | Discharge: 2016-08-26 | Disposition: A | Discharge status: Home / Self Care | Payer: Medicaid Other | Source: Ambulatory Visit | Attending: Cardiology | Admitting: Cardiology

## 2016-08-26 DIAGNOSIS — Z01812 Encounter for preprocedural laboratory examination: Secondary | ICD-10-CM | POA: Insufficient documentation

## 2016-08-26 DIAGNOSIS — R001 Bradycardia, unspecified: Secondary | ICD-10-CM | POA: Diagnosis not present

## 2016-08-26 DIAGNOSIS — Z01818 Encounter for other preprocedural examination: Secondary | ICD-10-CM

## 2016-08-26 DIAGNOSIS — Z01810 Encounter for preprocedural cardiovascular examination: Secondary | ICD-10-CM | POA: Diagnosis present

## 2016-08-26 HISTORY — DX: Unspecified asthma, uncomplicated: J45.909

## 2016-08-26 HISTORY — DX: Anxiety disorder, unspecified: F41.9

## 2016-08-26 LAB — DIFFERENTIAL
Basophils Absolute: 0.1 10*3/uL (ref 0–0.1)
Basophils Relative: 1 %
EOS ABS: 0.2 10*3/uL (ref 0–0.7)
EOS PCT: 2 %
LYMPHS ABS: 2.7 10*3/uL (ref 1.0–3.6)
Lymphocytes Relative: 37 %
Monocytes Absolute: 0.7 10*3/uL (ref 0.2–0.9)
Monocytes Relative: 9 %
NEUTROS PCT: 51 %
Neutro Abs: 3.7 10*3/uL (ref 1.4–6.5)

## 2016-08-26 LAB — BASIC METABOLIC PANEL
Anion gap: 7 (ref 5–15)
BUN: 13 mg/dL (ref 6–20)
CALCIUM: 9.5 mg/dL (ref 8.9–10.3)
CO2: 28 mmol/L (ref 22–32)
CREATININE: 0.71 mg/dL (ref 0.44–1.00)
Chloride: 105 mmol/L (ref 101–111)
GFR calc non Af Amer: >60 mL/min (ref >60)
GLUCOSE: 80 mg/dL (ref 65–99)
Potassium: 4.1 mmol/L (ref 3.5–5.1)
Sodium: 140 mmol/L (ref 135–145)

## 2016-08-26 LAB — CBC
HCT: 44.2 % (ref 35.0–47.0)
HEMOGLOBIN: 14.8 g/dL (ref 12.0–16.0)
MCH: 31.2 pg (ref 26.0–34.0)
MCHC: 33.5 g/dL (ref 32.0–36.0)
MCV: 93.1 fL (ref 80.0–100.0)
Platelets: 238 10*3/uL (ref 150–440)
RBC: 4.74 MIL/uL (ref 3.80–5.20)
RDW: 12.7 % (ref 11.5–14.5)
WBC: 7.3 10*3/uL (ref 3.6–11.0)

## 2016-08-26 LAB — APTT: aPTT: 31 seconds (ref 24–36)

## 2016-08-26 LAB — SURGICAL PCR SCREEN
MRSA, PCR: NEGATIVE
Staphylococcus aureus: NEGATIVE

## 2016-08-26 LAB — PROTIME-INR
INR: 1.05
Prothrombin Time: 13.7 seconds (ref 11.4–15.2)

## 2016-08-26 NOTE — Patient Instructions (Signed)
Your procedure is scheduled on: Wednesday 09/03/16 Report to [place]. 2ND FLOOR MEDICAL MALL ENTRANCE. To find out your arrival time please call [phone] between 1PM - 3PM on Tuesday 09/02/16.  Remember: Instructions that are not followed completely may result in serious medical risk, up to and including death, or upon the discretion of your surgeon and anesthesiologist your surgery may need to be rescheduled.    __X__ 1. Do not eat food or drink liquids after midnight. No gum chewing or hard candies.     __X__ 2. No Alcohol for 24 hours before or after surgery.   ____ 3. Bring all medications with you on the day of surgery if instructed.    __X__ 4. Notify your doctor if there is any change in your medical condition     (cold, fever, infections).             ___X__5. No smoking within 24 hours of your surgery.     Do not wear jewelry, make-up, hairpins, clips or nail polish.  Do not wear lotions, powders, or perfumes.   Do not shave 48 hours prior to surgery. Men may shave face and neck.  Do not bring valuables to the hospital.    [facility] is not responsible for any belongings or valuables.               Contacts, dentures or bridgework may not be worn into surgery.  Leave your suitcase in the car. After surgery it may be brought to your room.  For patients admitted to the hospital, discharge time is determined by your                treatment team.   Patients discharged the day of surgery will not be allowed to drive home.   Please read over the following fact sheets that you were given:   Pain Booklet and MRSA Information   ____ Take these medicines the morning of surgery with A SIP OF WATER:    1. NONE  2.   3.   4.  5.  6.  ____ Fleet Enema (as directed)   __X__ Use CHG Soap as directed  __X__ Use inhalers on the day of surgery  ____ Stop metformin 2 days prior to surgery    ____ Take 1/2 of usual insulin dose the night before surgery and none on the  morning of surgery.   ____ Stop Coumadin/Plavix/aspirin on   __X__ Stop Anti-inflammatories such as Advil, Aleve, Ibuprofen, Motrin, Naproxen, Naprosyn, Goodies,powder, or aspirin products.  OK to take Tylenol.   ____ Stop supplements until after surgery.    ____ Bring C-Pap to the hospital.

## 2016-09-01 ENCOUNTER — Ambulatory Visit: Admit: 2016-09-01 | Payer: Medicaid Other | Admitting: Gastroenterology

## 2016-09-01 SURGERY — COLONOSCOPY WITH PROPOFOL
Anesthesia: General

## 2016-09-02 MED ORDER — CEFAZOLIN IN D5W 1 GM/50ML IV SOLN
1.0000 g | Freq: Once | INTRAVENOUS | Status: AC
  Administered 2016-09-03: 1 g via INTRAVENOUS

## 2016-09-02 MED ORDER — GENTAMICIN SULFATE 40 MG/ML IJ SOLN
Freq: Once | INTRAMUSCULAR | Status: DC
  Filled 2016-09-02: qty 2

## 2016-09-03 ENCOUNTER — Ambulatory Visit: Payer: Medicaid Other

## 2016-09-03 ENCOUNTER — Encounter: Payer: Self-pay | Admitting: *Deleted

## 2016-09-03 ENCOUNTER — Observation Stay
Admission: RE | Admit: 2016-09-03 | Discharge: 2016-09-04 | Disposition: A | Discharge status: Home / Self Care | Payer: Medicaid Other | Source: Ambulatory Visit | Attending: Cardiology | Admitting: Cardiology

## 2016-09-03 ENCOUNTER — Ambulatory Visit: Payer: Medicaid Other | Admitting: Anesthesiology

## 2016-09-03 ENCOUNTER — Encounter
Admission: RE | Disposition: A | Discharge status: Home / Self Care | Payer: Self-pay | Source: Ambulatory Visit | Attending: Cardiology

## 2016-09-03 ENCOUNTER — Observation Stay: Payer: Medicaid Other

## 2016-09-03 DIAGNOSIS — F329 Major depressive disorder, single episode, unspecified: Secondary | ICD-10-CM | POA: Insufficient documentation

## 2016-09-03 DIAGNOSIS — I4589 Other specified conduction disorders: Secondary | ICD-10-CM | POA: Diagnosis not present

## 2016-09-03 DIAGNOSIS — Z87891 Personal history of nicotine dependence: Secondary | ICD-10-CM | POA: Diagnosis not present

## 2016-09-03 DIAGNOSIS — Z950 Presence of cardiac pacemaker: Secondary | ICD-10-CM

## 2016-09-03 DIAGNOSIS — I495 Sick sinus syndrome: Principal | ICD-10-CM | POA: Insufficient documentation

## 2016-09-03 DIAGNOSIS — R001 Bradycardia, unspecified: Secondary | ICD-10-CM | POA: Diagnosis present

## 2016-09-03 DIAGNOSIS — K5900 Constipation, unspecified: Secondary | ICD-10-CM | POA: Insufficient documentation

## 2016-09-03 DIAGNOSIS — J449 Chronic obstructive pulmonary disease, unspecified: Secondary | ICD-10-CM | POA: Diagnosis not present

## 2016-09-03 DIAGNOSIS — Z45010 Encounter for checking and testing of cardiac pacemaker pulse generator [battery]: Secondary | ICD-10-CM | POA: Diagnosis not present

## 2016-09-03 HISTORY — PX: PACEMAKER INSERTION: SHX728

## 2016-09-03 SURGERY — INSERTION, CARDIAC PACEMAKER
Anesthesia: General

## 2016-09-03 MED ORDER — FENTANYL CITRATE (PF) 100 MCG/2ML IJ SOLN
INTRAMUSCULAR | Status: AC
  Administered 2016-09-03: 50 ug via INTRAVENOUS
  Filled 2016-09-03: qty 2

## 2016-09-03 MED ORDER — LIDOCAINE HCL (PF) 2 % IJ SOLN
INTRAMUSCULAR | Status: AC
  Filled 2016-09-03: qty 2

## 2016-09-03 MED ORDER — FENTANYL CITRATE (PF) 100 MCG/2ML IJ SOLN
25.0000 ug | INTRAMUSCULAR | Status: DC | PRN
  Administered 2016-09-03 (×3): 50 ug via INTRAVENOUS

## 2016-09-03 MED ORDER — FENTANYL CITRATE (PF) 100 MCG/2ML IJ SOLN
INTRAMUSCULAR | Status: AC
  Filled 2016-09-03: qty 2

## 2016-09-03 MED ORDER — CEFAZOLIN IN D5W 1 GM/50ML IV SOLN
INTRAVENOUS | Status: AC
  Filled 2016-09-03: qty 50

## 2016-09-03 MED ORDER — LIDOCAINE 1 % OPTIME INJ - NO CHARGE
INTRAMUSCULAR | Status: DC | PRN
  Administered 2016-09-03: 30 mL

## 2016-09-03 MED ORDER — PROPOFOL 500 MG/50ML IV EMUL
INTRAVENOUS | Status: DC | PRN
  Administered 2016-09-03: 25 ug/kg/min via INTRAVENOUS

## 2016-09-03 MED ORDER — IPRATROPIUM-ALBUTEROL 0.5-2.5 (3) MG/3ML IN SOLN
RESPIRATORY_TRACT | Status: AC
  Filled 2016-09-03: qty 3

## 2016-09-03 MED ORDER — FAMOTIDINE 20 MG PO TABS
20.0000 mg | ORAL_TABLET | Freq: Once | ORAL | Status: AC
  Administered 2016-09-03: 20 mg via ORAL

## 2016-09-03 MED ORDER — MIDAZOLAM HCL 2 MG/2ML IJ SOLN
INTRAMUSCULAR | Status: AC
  Filled 2016-09-03: qty 2

## 2016-09-03 MED ORDER — MIDAZOLAM HCL 2 MG/2ML IJ SOLN
INTRAMUSCULAR | Status: DC | PRN
  Administered 2016-09-03: 2 mg via INTRAVENOUS

## 2016-09-03 MED ORDER — FENTANYL CITRATE (PF) 100 MCG/2ML IJ SOLN
INTRAMUSCULAR | Status: DC | PRN
  Administered 2016-09-03 (×2): 50 ug via INTRAVENOUS

## 2016-09-03 MED ORDER — PROPOFOL 10 MG/ML IV BOLUS
INTRAVENOUS | Status: AC
  Filled 2016-09-03: qty 20

## 2016-09-03 MED ORDER — SODIUM CHLORIDE 0.9 % IJ SOLN
INTRAMUSCULAR | Status: DC | PRN
  Administered 2016-09-03: 100 mL via INTRAVENOUS

## 2016-09-03 MED ORDER — EPHEDRINE SULFATE 50 MG/ML IJ SOLN
INTRAMUSCULAR | Status: DC | PRN
  Administered 2016-09-03 (×2): 10 mg via INTRAVENOUS
  Administered 2016-09-03: 5 mg via INTRAVENOUS

## 2016-09-03 MED ORDER — OXYCODONE-ACETAMINOPHEN 5-325 MG PO TABS
1.0000 | ORAL_TABLET | Freq: Four times a day (QID) | ORAL | Status: DC | PRN
  Administered 2016-09-03 (×2): 1 via ORAL
  Administered 2016-09-04: 2 via ORAL
  Filled 2016-09-03 (×2): qty 1
  Filled 2016-09-03: qty 2

## 2016-09-03 MED ORDER — IPRATROPIUM BROMIDE HFA 17 MCG/ACT IN AERS: 2.0000 | INHALATION_SPRAY | RESPIRATORY_TRACT | Status: DC

## 2016-09-03 MED ORDER — OXYCODONE HCL 5 MG PO TABS
ORAL_TABLET | ORAL | Status: AC
  Filled 2016-09-03: qty 1

## 2016-09-03 MED ORDER — ACETAMINOPHEN 325 MG PO TABS
325.0000 mg | ORAL_TABLET | ORAL | Status: DC | PRN
Start: 2016-09-03 — End: 2016-09-04

## 2016-09-03 MED ORDER — IPRATROPIUM BROMIDE 0.02 % IN SOLN: 0.5000 mg | RESPIRATORY_TRACT | Status: DC | PRN

## 2016-09-03 MED ORDER — LACTATED RINGERS IV SOLN
INTRAVENOUS | Status: DC
  Administered 2016-09-03: 11:00:00 via INTRAVENOUS

## 2016-09-03 MED ORDER — OXYCODONE HCL 5 MG/5ML PO SOLN: 5.0000 mg | Freq: Once | ORAL | Status: AC | PRN

## 2016-09-03 MED ORDER — OXYCODONE HCL 5 MG PO TABS
5.0000 mg | ORAL_TABLET | Freq: Once | ORAL | Status: AC | PRN
  Administered 2016-09-03: 5 mg via ORAL

## 2016-09-03 MED ORDER — IPRATROPIUM-ALBUTEROL 0.5-2.5 (3) MG/3ML IN SOLN
3.0000 mL | Freq: Once | RESPIRATORY_TRACT | Status: AC
  Administered 2016-09-03: 3 mL via RESPIRATORY_TRACT

## 2016-09-03 MED ORDER — ONDANSETRON HCL 4 MG PO TABS: 4.0000 mg | ORAL_TABLET | Freq: Three times a day (TID) | ORAL | Status: DC | PRN

## 2016-09-03 MED ORDER — FAMOTIDINE 20 MG PO TABS
ORAL_TABLET | ORAL | Status: AC
  Filled 2016-09-03: qty 1

## 2016-09-03 MED ORDER — CEFAZOLIN IN D5W 1 GM/50ML IV SOLN
1.0000 g | Freq: Four times a day (QID) | INTRAVENOUS | Status: AC
  Administered 2016-09-03 – 2016-09-04 (×3): 1 g via INTRAVENOUS
  Filled 2016-09-03 (×3): qty 50

## 2016-09-03 MED ORDER — PHENYLEPHRINE HCL 10 MG/ML IJ SOLN
INTRAMUSCULAR | Status: DC | PRN
  Administered 2016-09-03: 100 ug via INTRAVENOUS

## 2016-09-03 MED ORDER — ALBUTEROL SULFATE (2.5 MG/3ML) 0.083% IN NEBU
2.5000 mg | INHALATION_SOLUTION | RESPIRATORY_TRACT | Status: DC | PRN
  Filled 2016-09-03: qty 3

## 2016-09-03 MED ORDER — LIDOCAINE HCL (CARDIAC) 20 MG/ML IV SOLN
INTRAVENOUS | Status: DC | PRN
  Administered 2016-09-03: 50 mg via INTRAVENOUS

## 2016-09-03 MED ORDER — MONTELUKAST SODIUM 10 MG PO TABS
10.0000 mg | ORAL_TABLET | Freq: Every day | ORAL | Status: DC
  Filled 2016-09-03: qty 1

## 2016-09-03 MED ORDER — SODIUM CHLORIDE 0.9 % IR SOLN
Status: DC | PRN
  Administered 2016-09-03: 400 mL

## 2016-09-03 MED ORDER — ONDANSETRON HCL 4 MG/2ML IJ SOLN: 4.0000 mg | Freq: Four times a day (QID) | INTRAMUSCULAR | Status: DC | PRN

## 2016-09-03 SURGICAL SUPPLY — 36 items
BAG DECANTER FOR FLEXI CONT (MISCELLANEOUS) ×2 IMPLANT
BRUSH SCRUB 4% CHG (MISCELLANEOUS) ×2 IMPLANT
CABLE SURG 12 DISP A/V CHANNEL (MISCELLANEOUS) ×2 IMPLANT
CANISTER SUCT 1200ML W/VALVE (MISCELLANEOUS) ×2 IMPLANT
CHLORAPREP W/TINT 26ML (MISCELLANEOUS) ×2 IMPLANT
COVER LIGHT HANDLE STERIS (MISCELLANEOUS) ×4 IMPLANT
COVER MAYO STAND STRL (DRAPES) ×2 IMPLANT
DEVICE DISSECT PLASMABLAD 3.0S (MISCELLANEOUS) IMPLANT
DRAPE C-ARM XRAY 36X54 (DRAPES) ×2 IMPLANT
DRESSING TELFA 4X3 1S ST N-ADH (GAUZE/BANDAGES/DRESSINGS) ×2 IMPLANT
DRSG TEGADERM 4X4.75 (GAUZE/BANDAGES/DRESSINGS) ×2 IMPLANT
ELECT REM PT RETURN 9FT ADLT (ELECTROSURGICAL) ×2
ELECTRODE REM PT RTRN 9FT ADLT (ELECTROSURGICAL) ×1 IMPLANT
GLOVE BIO SURGEON STRL SZ7.5 (GLOVE) ×2 IMPLANT
GLOVE BIO SURGEON STRL SZ8 (GLOVE) ×2 IMPLANT
GOWN STRL REUS W/ TWL LRG LVL3 (GOWN DISPOSABLE) ×1 IMPLANT
GOWN STRL REUS W/ TWL XL LVL3 (GOWN DISPOSABLE) ×1 IMPLANT
GOWN STRL REUS W/TWL LRG LVL3 (GOWN DISPOSABLE) ×2
GOWN STRL REUS W/TWL XL LVL3 (GOWN DISPOSABLE) ×2
IMMOBILIZER SHDR MD LX WHT (SOFTGOODS) IMPLANT
IMMOBILIZER SHDR XL LX WHT (SOFTGOODS) ×2 IMPLANT
INTRO PACEMKR SHEATH II 7FR (MISCELLANEOUS) ×2
INTRODUCER PACEMKR SHTH II 7FR (MISCELLANEOUS) ×1 IMPLANT
IV NS 500ML (IV SOLUTION) ×2
IV NS 500ML BAXH (IV SOLUTION) ×1 IMPLANT
KIT RM TURNOVER STRD PROC AR (KITS) ×2 IMPLANT
KIT STYLET 52CM (Orthopedic Implant) ×1 IMPLANT
LABEL OR SOLS (LABEL) ×2 IMPLANT
LEAD CAPSURE NOVUS 5076-52CM (Lead) ×1 IMPLANT
LEAD CAPSURE NOVUS 5076-58CM (Lead) ×1 IMPLANT
MARKER SKIN DUAL TIP RULER LAB (MISCELLANEOUS) ×2 IMPLANT
PACK PACE INSERTION (MISCELLANEOUS) ×2 IMPLANT
PAD ONESTEP ZOLL R SERIES ADT (MISCELLANEOUS) ×2 IMPLANT
PLASMABLADE 3.0S (MISCELLANEOUS)
PPM ADVISA MRI DR A2DR01 (Pacemaker) ×1 IMPLANT
SUT SILK 0 SH 30 (SUTURE) ×6 IMPLANT

## 2016-09-03 NOTE — Anesthesia Preprocedure Evaluation (Signed)
Anesthesia Evaluation  Patient identified by MRN, date of birth, ID band Patient awake    Reviewed: Allergy & Precautions, H&P , NPO status , Patient's Chart, lab work & pertinent test results  History of Anesthesia Complications Negative for: history of anesthetic complications  Airway Mallampati: III  TM Distance: >3 FB Neck ROM: full    Dental  (+) Poor Dentition, Chipped   Pulmonary neg shortness of breath, asthma , COPD, former smoker,    Pulmonary exam normal breath sounds clear to auscultation       Cardiovascular Exercise Tolerance: Good Normal cardiovascular exam+ dysrhythmias  Rhythm:regular Rate:Normal     Neuro/Psych PSYCHIATRIC DISORDERS negative neurological ROS     GI/Hepatic negative GI ROS, Neg liver ROS, neg GERD  ,  Endo/Other  negative endocrine ROS  Renal/GU negative Renal ROS  negative genitourinary   Musculoskeletal  (+) Arthritis ,   Abdominal   Peds  Hematology negative hematology ROS (+)   Anesthesia Other Findings Past Medical History: No date: Acute bronchitis No date: Acute upper respiratory infections of unspecif* No date: Anxiety No date: Asthma No date: Backache, unspecified No date: Bradycardia No date: Chronic airway obstruction, not elsewhere clas* No date: Depressive disorder, not elsewhere classified No date: Hypotension, unspecified No date: Osteoarthrosis, unspecified whether generalize* No date: Other and unspecified hyperlipidemia No date: Screening for diabetes mellitus No date: Sinusitis No date: Umbilical hernia without mention of obstructio* No date: Unspecified hypertrophic and atrophic conditio* No date: Unspecified local infection of skin and subcut* No date: Urinary calculus, unspecified  Past Surgical History: No date: BACK SURGERY 09/20/2009: CARDIAC CATHETERIZATION     Comment: [facility] No date: CHOLECYSTECTOMY No date: HERNIA REPAIR     Comment:  x 2 No date: ROTATOR CUFF REPAIR  BMI    Body Mass Index:  39.69 kg/m      Reproductive/Obstetrics negative OB ROS                             Anesthesia Physical Anesthesia Plan  ASA: III  Anesthesia Plan: General   Post-op Pain Management:    Induction:   Airway Management Planned:   Additional Equipment:   Intra-op Plan:   Post-operative Plan:   Informed Consent: I have reviewed the patients History and Physical, chart, labs and discussed the procedure including the risks, benefits and alternatives for the proposed anesthesia with the patient or authorized representative who has indicated his/her understanding and acceptance.   Dental Advisory Given  Plan Discussed with: Anesthesiologist, CRNA and Surgeon  Anesthesia Plan Comments:         Anesthesia Quick Evaluation

## 2016-09-03 NOTE — OR Nursing (Signed)
Will take gentamycin to OR desk when arrives in [place] from pharmacy (ok per Gustavo Lah, RN)

## 2016-09-03 NOTE — Anesthesia Procedure Notes (Signed)
Date/Time: 09/03/2016 12:24 PM Performed by: Johnna Acosta Pre-anesthesia Checklist: Patient identified, Emergency Drugs available, Suction available, Patient being monitored and Timeout performed Patient Re-evaluated:Patient Re-evaluated prior to inductionOxygen Delivery Method: Nasal cannula

## 2016-09-03 NOTE — Anesthesia Post-op Follow-up Note (Cosign Needed)
Anesthesia QCDR form completed.        

## 2016-09-03 NOTE — H&P (Signed)
<6>29299-5<7>Encounter Details<6>46240-8<7>Social History<6>29762-2<7>Last Filed Vital Signs<6>8716-3<7>Instructions<6>69730-0<7>Progress Notes<6>10164-2<7>Plan of Treatment<6>18776-5<7>Visit Diagnoses<6>51848-0<7>/"> Jump to Section ? Document InformationEncounter DetailsInstructionsLast Filed Vital SignsPatient DemographicsPlan of TreatmentProgress NotesReason for VisitSocial HistoryVisit Diagnoses Angelica Zhang Encounter Summary, generated on Jan. 24, 2018 Printout Information  Document Contents Office Visit Document Received Date Jan. 24, 2018 Document Source Organization [place]   Patient Demographics - 61 y.o. Female, born 06-19-1956   Patient Address Communication Language Race / Ethnicity  [address], [place] [address] [phone] [facility]) [phone] (Mobile) English (Preferred) White / Not Hispanic or Latino  Reason for Visit    Reason Comments  Follow-up per pt for pacemaker  Fatigue very tired all the time  Shortness of Breath with or without excertion  Palpitations wakes her up  Edema occasionally  Dizziness has alot    Encounter Details    Date Type Department Care Team Description  08/19/2016 Office Visit [facility]  [place], [place] [address]  [phone]  Isaias Cowman, [place]  [facility]  [place], [place] [address]  [phone]  [phone] (Fax)  Bradycardia, sinus (Primary Dx);  COPD, mild , unspecified (CMS-HCC);  H/O cardiac catheterization;  SOB (shortness of breath) on exertion   Social History - as of this encounter   Tobacco Use Types Packs/Day Years Used Date  Former Smoker    Quit: 05/12/2015  Smokeless Tobacco: Never Used      Alcohol Use Drinks/Week oz/Week Comments  No      Sex Assigned at [employer] Date Recorded  Not on file    Last Filed Vital Signs - in this encounter   Vital Sign Reading Time  Taken  Blood Pressure 122/62 08/19/2016 2:17 PM EST  Pulse 48 08/19/2016 2:17 PM EST  Temperature - -  Respiratory Rate - -  Oxygen Saturation 99% 08/19/2016 2:17 PM EST  Inhaled Oxygen Concentration - -  Weight 98.5 kg (217 lb 3.2 oz) 08/19/2016 2:17 PM EST  Height 157.5 cm (5\' 2" ) 08/19/2016 2:17 PM EST  Body Mass Index 39.73 08/19/2016 2:17 PM EST   Instructions - in this encounter   Patient Instructions - Angelica Zhang, [place] - 08/19/2016 2:00 PM EST  Patient Education  DASH Eating Plan DASH stands for "Dietary Approaches to Stop Hypertension." The DASH eating plan is a healthy eating plan that has been shown to reduce high blood pressure (hypertension). Additional health benefits may include reducing the risk of type 2 diabetes mellitus, heart disease, and stroke. The DASH eating plan may also help with weight loss. What do I need to know about the DASH eating plan? For the DASH eating plan, you will follow these general guidelines:  Choose foods with less than 150 milligrams of sodium per serving (as listed on the food label).  Use salt-free seasonings or herbs instead of table salt or sea salt.  Check with your health care provider or pharmacist before using salt substitutes.  Eat lower-sodium products. These are often labeled as "low-sodium" or "no salt added."  Eat fresh foods. Avoid eating a lot of canned foods.  Eat more vegetables, fruits, and low-fat dairy products.  Choose whole grains. Look for the word "whole" as the first word in the ingredient list.  Choose fish and skinless chicken or [place] more often than red meat. Limit fish, poultry, and meat to 6 oz (170 g) each day.  Limit sweets, desserts, sugars, and sugary drinks.  Choose heart-healthy fats.  Eat more  home-cooked food and less restaurant, buffet, and fast food.  Limit fried foods.  Do not fry foods. Cook foods using methods such as baking, boiling, grilling, and broiling instead.  When  eating at a restaurant, ask that your food be prepared with less salt, or no salt if possible. What foods can I eat? Seek help from a dietitian for individual calorie needs. Grains  Whole grain or whole wheat bread. Brown rice. Whole grain or whole wheat pasta. Quinoa, bulgur, and whole grain cereals. Low-sodium cereals. Corn or whole wheat flour tortillas. Whole grain cornbread. Whole grain crackers. Low-sodium crackers. Vegetables  Fresh or frozen vegetables (raw, steamed, roasted, or grilled). Low-sodium or reduced-sodium tomato and vegetable juices. Low-sodium or reduced-sodium tomato sauce and paste. Low-sodium or reduced-sodium canned vegetables. Fruits  All fresh, canned (in natural juice), or frozen fruits. Meat and Other Protein Products  Ground beef (85% or leaner), grass-fed beef, or beef trimmed of fat. Skinless chicken or [place]. Ground chicken or [place]. Pork trimmed of fat. All fish and seafood. Eggs. Dried beans, peas, or lentils. Unsalted nuts and seeds. Unsalted canned beans. Dairy  Low-fat dairy products, such as skim or 1% milk, 2% or reduced-fat cheeses, low-fat ricotta or cottage cheese, or plain low-fat yogurt. Low-sodium or reduced-sodium cheeses. Fats and Oils  Tub margarines without trans fats. Light or reduced-fat mayonnaise and salad dressings (reduced sodium). Avocado. Safflower, olive, or canola oils. Natural peanut or almond butter. Other  Unsalted popcorn and pretzels. The items listed above may not be a complete list of recommended foods or beverages. Contact your dietitian for more options.  What foods are not recommended? Grains  White bread. White pasta. White rice. Refined cornbread. Bagels and croissants. Crackers that contain trans fat. Vegetables  Creamed or fried vegetables. Vegetables in a cheese sauce. Regular canned vegetables. Regular canned tomato sauce and paste. Regular tomato and vegetable juices. Fruits  Canned fruit in light or heavy syrup.  Fruit juice. Meat and Other Protein Products  Fatty cuts of meat. Ribs, chicken wings, bacon, sausage, bologna, salami, chitterlings, fatback, hot dogs, bratwurst, and packaged luncheon meats. Salted nuts and seeds. Canned beans with salt. Dairy  Whole or 2% milk, cream, half-and-half, and cream cheese. Whole-fat or sweetened yogurt. Full-fat cheeses or blue cheese. Nondairy creamers and whipped toppings. Processed cheese, cheese spreads, or cheese curds. Condiments  Onion and garlic salt, seasoned salt, table salt, and sea salt. Canned and packaged gravies. Worcestershire sauce. Tartar sauce. Barbecue sauce. Teriyaki sauce. Soy sauce, including reduced sodium. Steak sauce. Fish sauce. Oyster sauce. Cocktail sauce. Horseradish. Ketchup and mustard. Meat flavorings and tenderizers. Bouillon cubes. Hot sauce. Tabasco sauce. Marinades. Taco seasonings. Relishes. Fats and Oils  Butter, stick margarine, lard, shortening, ghee, and bacon fat. Coconut, palm kernel, or palm oils. Regular salad dressings. Other  Pickles and olives. Salted popcorn and pretzels. The items listed above may not be a complete list of foods and beverages to avoid. Contact your dietitian for more information.  Where can I find more information? National Heart, Lung, and Blood Institute: travelstabloid.com This information is not intended to replace advice given to you by your health care provider. Make sure you discuss any questions you have with your health care provider. Document Released: 07/17/2011 Document Revised: 01/03/2016 Document Reviewed: 06/01/2013 Elsevier Interactive Patient Education  2017 [employer].   Patient Education  Fat and Cholesterol Restricted Diet High levels of fat and cholesterol in your blood may lead to various health problems, such as diseases of  the heart, blood vessels, gallbladder, liver, and pancreas. Fats are concentrated sources of energy that come in  various forms. Certain types of fat, including saturated fat, may be harmful in excess. Cholesterol is a substance needed by your body in small amounts. Your body makes all the cholesterol it needs. Excess cholesterol comes from the food you eat. When you have high levels of cholesterol and saturated fat in your blood, health problems can develop because the excess fat and cholesterol will gather along the walls of your blood vessels, causing them to narrow. Choosing the right foods will help you control your intake of fat and cholesterol. This will help keep the levels of these substances in your blood within normal limits and reduce your risk of disease. What is my plan? Your health care provider recommends that you:  Limit your fat intake to ______% or less of your total calories per day.  Limit the amount of cholesterol in your diet to less than _________mg per day.  Eat 20-30 grams of fiber each day. What types of fat should I choose?  Choose healthy fats more often. Choose monounsaturated and polyunsaturated fats, such as olive and canola oil, flaxseeds, walnuts, almonds, and seeds.  Eat more omega-3 fats. Good choices include salmon, mackerel, sardines, tuna, flaxseed oil, and ground flaxseeds. Aim to eat fish at least two times a week.  Limit saturated fats. Saturated fats are primarily found in animal products, such as meats, butter, and cream. Plant sources of saturated fats include palm oil, palm kernel oil, and coconut oil.  Avoid foods with partially hydrogenated oils in them. These contain trans fats. Examples of foods that contain trans fats are stick margarine, some tub margarines, cookies, crackers, and other baked goods. What general guidelines do I need to follow? These guidelines for healthy eating will help you control your intake of fat and cholesterol:  Check food labels carefully to identify foods with trans fats or high amounts of saturated fat.  Fill one half of your  plate with vegetables and green salads.  Fill one fourth of your plate with whole grains. Look for the word "whole" as the first word in the ingredient list.  Fill one fourth of your plate with lean protein foods.  Limit fruit to two servings a day. Choose fruit instead of juice.  Eat more foods that contain fiber, such as apples, broccoli, carrots, beans, peas, and barley.  Eat more home-cooked food and less restaurant, buffet, and fast food.  Limit or avoid alcohol.  Limit foods high in starch and sugar.  Limit fried foods.  Cook foods using methods other than frying. Baking, boiling, grilling, and broiling are all great options.  Lose weight if you are overweight. Losing just 5-10% of your initial body weight can help your overall health and prevent diseases such as diabetes and heart disease. What foods can I eat? Grains  Whole grains, such as whole wheat or whole grain breads, crackers, cereals, and pasta. Unsweetened oatmeal, bulgur, barley, quinoa, or brown rice. Corn or whole wheat flour tortillas. Vegetables  Fresh or frozen vegetables (raw, steamed, roasted, or grilled). Green salads. Fruits  All fresh, canned (in natural juice), or frozen fruits. Meats and other protein foods  Ground beef (85% or leaner), grass-fed beef, or beef trimmed of fat. Skinless chicken or [place]. Ground chicken or [place]. Pork trimmed of fat. All fish and seafood. Eggs. Dried beans, peas, or lentils. Unsalted nuts or seeds. Unsalted canned or dry beans. Dairy  Low-fat dairy products, such as skim or 1% milk, 2% or reduced-fat cheeses, low-fat ricotta or cottage cheese, or plain low-fat yo Fats and oils  Tub margarines without trans fats. Light or reduced-fat mayonnaise and salad dressings. Avocado. Olive, canola, sesame, or safflower oils. Natural peanut or almond butter (choose ones without added sugar and oil). The items listed above may not be a complete list of recommended foods or  beverages. Contact your dietitian for more options.  Foods to avoid Grains  White bread. White pasta. White rice. Cornbread. Bagels, pastries, and croissants. Crackers that contain trans fat. Vegetables  White potatoes. Corn. Creamed or fried vegetables. Vegetables in a cheese sauce. Fruits  Dried fruits. Canned fruit in light or heavy syrup. Fruit juice. Meats and other protein foods  Fatty cuts of meat. Ribs, chicken wings, bacon, sausage, bologna, salami, chitterlings, fatback, hot dogs, bratwurst, and packaged luncheon meats. Liver and organ meats. Dairy  Whole or 2% milk, cream, half-and-half, and cream cheese. Whole milk cheeses. Whole-fat or sweetened yogurt. Full-fat cheeses. Nondairy creamers and whipped toppings. Processed cheese, cheese spreads, or cheese curds. Beverages  Alcohol. Sweetened drinks (such as sodas, lemonade, and fruit drinks or punches). Fats and oils  Butter, stick margarine, lard, shortening, ghee, or bacon fat. Coconut, palm kernel, or palm oils. Sweets and desserts  Corn syrup, sugars, honey, and molasses. Candy. Jam and jelly. Syrup. Sweetened cereals. Cookies, pies, cakes, donuts, muffins, and ice cream. The items listed above may not be a complete list of foods and beverages to avoid. Contact your dietitian for more information.  This information is not intended to replace advice given to you by your health care provider. Make sure you discuss any questions you have with your health care provider. Document Released: 07/28/2005 Document Revised: 08/18/2014 Document Reviewed: 10/26/2013 Elsevier Interactive Patient Education  2017 [employer].      Progress Notes - in this encounter   Angelica Zhang, [place] - 08/19/2016 2:00 PM EST Formatting of this note may be different from the original. Established Patient Visit   Chief Complaint: Chief Complaint  Patient presents with  . Follow-up  per pt for pacemaker  . Fatigue  very tired all  the time  . Shortness of Breath  with or without excertion  . Palpitations  wakes her up  . Edema  occasionally  . Dizziness  has alot  Date of Service: 08/19/2016 Date of Birth: 01-Sep-1955 PCP: JESSICA ANNA Elsworth Soho, NP  History of Present Illness: Angelica Zhang is a 61 y.o.female patient who returns for evaluation of bradycardia. Patient reports that she was first diagnosed with bradycardia, approximately 6 years ago. Consequently, she has been experiencing fatigue, dizziness, and weakness over the last several months and wishes to pursue pacemaker implantation, which has been discussed in detail at prior visits with Dr. Saralyn Pilar. She denies presyncope or syncope. She has a history of experiencing brief left-sided chest discomfort at rest and with exertion with chronic exertional dyspnea that is unchanged. 2D echocardiogram 05/23/2016 revealed normal left ventricular function, with LVEF greater than 55%. 24-hour Holter monitor revealed predominant sinus bradycardia with a mean heart rate of 56 bpm with its in the 40s during sleep. She underwent ETT on 06/05/2016 and exercised 3 minutes and 39 seconds on a Bruce protocol. Patient achieved a maximal heart rate of 116 bpm which was 72% of MPHR. The patient experienced mild chest discomfort with shortness of breath and fatigue. There were no diagnostic ECG changes. Patient has lost at least  65 pounds over the last year, which she attributes to exercise, improved diet and lack of appetite. She denies night sweats. She has a planned colonoscopy for evaluation of abdominal distention and pain as well as constipation. The patient typically is active, but does not do any structured exercise.   Past Medical and Surgical History  Past Medical History Past Medical History:  Diagnosis Date  . Allergic rhinitis due to allergen  . Anxiety attack  . Asthma attack, unspecified  . COPD (chronic obstructive pulmonary disease) (CMS-HCC)  . Depression (emotion),  unspecified   Past Surgical History She has a past surgical history that includes Back surgery; Hernia surgery; Rotor cuff surgery; and Cholecystectomy.   Medications and Allergies  Current Medications  Current Outpatient Prescriptions  Medication Sig Dispense Refill  . albuterol (PROVENTIL) 2.5 mg /3 mL (0.083 %) nebulizer solution Take 3 mLs by nebulization every 4 (four) hours as needed. For shortness of breath and wheezing.  Marland Kitchen albuterol 90 mcg/actuation inhaler Inhale into the lungs.  . ALPRAZolam (XANAX) 1 MG tablet Take by mouth nightly. Patient takes 1/2 tablet at night.  . cefUROXime (CEFTIN) 250 MG tablet Take 1 tablet (250 mg total) by mouth 2 (two) times daily. 20 tablet 0  . cetirizine (ZYRTEC) 10 MG tablet Take 10 mg by mouth once daily.  . cholecalciferol (CHOLECALCIFEROL) 1,000 unit tablet Take 1,000 Units by mouth once daily.  Marland Kitchen DOCUSATE CALCIUM (STOOL SOFTENER ORAL) Take 1 tablet by mouth 2 (two) times daily.  . fluticasone-salmeterol (ADVAIR HFA) 230-21 mcg/actuation inhaler Inhale into the lungs.  Marland Kitchen ipratropium (ATROVENT HFA) inhaler Inhale into the lungs.  . montelukast (SINGULAIR) 10 mg tablet Take by mouth.  . multivitamin capsule Take 1 capsule by mouth once daily.  . ondansetron (ZOFRAN) 4 MG tablet Take 1 tablet (4 mg total) by mouth 2 (two) times daily. 20 tablet 0  . polyethylene glycol (MIRALAX) powder Take as directed for colonic prep. 255 g 0  . polyethylene glycol (MIRALAX) powder Take 17 grams TID PRN for constipation. 255 g 0   No current facility-administered medications for this visit.   Allergies: Patient has no known allergies.  Social and Family History  Social History reports that she quit smoking about 15 months ago. She has never used smokeless tobacco. She reports that she does not drink alcohol or use drugs.  Family History Family History  Problem Relation Age of Onset  . Stroke Mother  . Heart disease Mother  . Diabetes type II Mother   . Heart disease Father  . Diabetes type II Father  . Stroke Father  . Depression Sister  . Depression Brother  . Depression Brother  . Depression Brother   Review of Systems   Review of Systems: The patient reports occasional episodes of chest pain, chronic shortness of breath, without orthopnea, paroxysmal nocturnal dyspnea, pedal edema, with occasional palpitations, heart racing, without presyncope, syncope. Review of 12 Systems is negative except as described above.  Physical Examination   Vitals:BP 122/62  Pulse (!) 48  Ht 157.5 cm (5\' 2" )  Wt 98.5 kg (217 lb 3.2 oz)  SpO2 99%  BMI 39.73 kg/m  Ht:157.5 cm (5\' 2" ) Wt:98.5 kg (217 lb 3.2 oz) [id number] surface area is 2.08 meters squared. Body mass index is 39.73 kg/m.  General: Alert and oriented. No acute distress. Well-appearing HEENT: Pupils equally reactive to light and accomodation  Neck: Supple, carotid pulses 2+ Lungs: Normal effort of breathing clear to auscultation bilaterally; no wheezes, rales,  rhonchi Heart: Bradycardic and regular rhythm. No gallops, murmurs or rub Abdomen: soft, tender, distended, with normal bowel sounds Extremities: no cyanosis, clubbing, or edema Peripheral Pulses: 2+ radial bilaterally Skin: Warm, dry, no diaphoresis  Assessment   61 y.o. female with  1. Bradycardia, sinus  2. COPD, mild , unspecified (CMS-HCC)  3. H/O cardiac catheterization  4. SOB (shortness of breath) on exertion   61 year old female with symptomatic sinus bradycardia with fatigue, weakness, occasional dizziness, without presyncope or syncope. 24-hour Holter monitor reveals predominant sinus bradycardia with a mean heart rate of 56 bpm, with marked bradycardia mostly during sleep. She has chronic exertional dyspnea with a history of COPD. 2D echocardiogram revealed normal left ventricular function. The patient has occasional nonexertional episodes chest pain, likely noncardiac in nature. Recent ETT did not reveal  evidence for ischemia. The patient did have chronotropic incompetence with a maximal heart rate of 116 bpm with 72% of MPHR.  Plan   1. Continue current medications 2. Counseled patient about low-sodium diet. 3. DASH diet printed instructions given to the patient.  4. Proceed with dual-chamber pacemaker. The risks and benefits as well as alternatives to pacemaker implantation were discussed with the patient, and she is agreeable to proceed. 5. Post-pone colonoscopy for 4-6 weeks post pacemaker implantation. 6. Follow-up with GI 7. Return to clinic 1 week after discharge from hospital.  No orders of the defined types were placed in this encounter. The exam findings were discussed with Dr. Saralyn Pilar who discussed with the patient the decision to proceed with pacemaker implantation.  No Follow-up on file. I personally performed the service, non-incident to. (WP)  ANNA MARIA DRANE, PA-C      Plan of Treatment - as of this encounter   Not on file   Visit Diagnoses    Diagnosis  Bradycardia, sinus - Primary  Other specified cardiac dysrhythmias   COPD, mild , unspecified (CMS-HCC)  H/O cardiac catheterization  SOB (shortness of breath) on exertion  Shortness of breath    Images Document Information   Primary Care Provider Merri Brunette NP (Oct. 20, 2016 - Present) [phone] (Work) [phone] (Fax) [place], [place] [address]  Document Coverage Dates Jan. 09, 2018  [place] [phone] (Work) [place], [place] [address]   Encounter Providers Isaias Cowman MD (Attending) [phone] (Work) [phone] (Fax) [facility] [place], [place] [address]   Encounter Date Jan. 09, 2018   Show All Sections

## 2016-09-03 NOTE — Transfer of Care (Signed)
Immediate Anesthesia Transfer of Care Note  Patient: Angelica Zhang  Procedure(s) Performed: Procedure(s): INSERTION PACEMAKER (N/A)  Patient Location: PACU  Anesthesia Type:General  Level of Consciousness: awake and alert   Airway & Oxygen Therapy: Patient Spontanous Breathing and Patient connected to nasal cannula oxygen  Post-op Assessment: Report given to RN and Post -op Vital signs reviewed and stable  Post vital signs: Reviewed and stable  Last Vitals:  Vitals:   09/03/16 1010  BP: 125/69  Pulse: (!) 54  Resp: 16  Temp: 36.3 C    Last Pain:  Vitals:   09/03/16 1010  TempSrc: Oral  PainSc: 4          Complications: No apparent anesthesia complications

## 2016-09-03 NOTE — Op Note (Signed)
[facility] Cardiology   09/03/2016                     1:30 PM  PATIENT:  Angelica Zhang    PRE-OPERATIVE DIAGNOSIS:  BRADYCARDIA,SSS  POST-OPERATIVE DIAGNOSIS:  Same  PROCEDURE:  INSERTION PACEMAKER  SURGEON:  Isaias Cowman, MD    ANESTHESIA:     PREOPERATIVE INDICATIONS:  Angelica Zhang is a  61 y.o. female with a diagnosis of [place] who failed conservative measures and elected for surgical management.    The risks benefits and alternatives were discussed with the patient preoperatively including but not limited to the risks of infection, bleeding, cardiopulmonary complications, the need for revision surgery, among others, and the patient was willing to proceed.   OPERATIVE PROCEDURE: The patient was brought to the operating room the fasting state. The left pectoral region was prepped and draped in the usual sterile manner. Anesthesia was obtained 1% lidocaine locally. A 6 cm incision was performed a left pectoral region. Access was obtained to left subclavian vein by fine needle aspiration. MRI compatible leads were positioned into the right ventricular apical septum and right atrial appendage under fluoroscopic guidance. After proper thresholds were obtained the leads were sutured in place. The leads were connected to an MRI compatible dual-chamber rate responsive pacemaker generator (Medtronic [id number]). The pacemaker pocket was irrigated with gentamicin solution. The pacemaker generator was positioned into the pocket and the pocket was closed with 2-0 and 4-0 Vicryl, respectively. Steri-Strips and a pressure dressing were applied.

## 2016-09-03 NOTE — Interval H&P Note (Signed)
History and Physical Interval Note:  09/03/2016 11:44 AM  Angelica Zhang  has presented today for surgery, with the diagnosis of BRADYCARDIA,SSS  The various methods of treatment have been discussed with the patient and family. After consideration of risks, benefits and other options for treatment, the patient has consented to  Procedure(s): INSERTION PACEMAKER (N/A) as a surgical intervention .  The patient's history has been reviewed, patient examined, no change in status, stable for surgery.  I have reviewed the patient's chart and labs.  Questions were answered to the patient's satisfaction.     Sheniece Ruggles [employer]

## 2016-09-04 ENCOUNTER — Encounter: Payer: Self-pay | Admitting: Cardiology

## 2016-09-04 DIAGNOSIS — I495 Sick sinus syndrome: Secondary | ICD-10-CM | POA: Diagnosis not present

## 2016-09-04 MED ORDER — CEPHALEXIN 250 MG PO CAPS: 250.0000 mg | ORAL_CAPSULE | Freq: Four times a day (QID) | ORAL | 7 refills | Status: DC

## 2016-09-04 NOTE — Discharge Summary (Signed)
   Physician Discharge Summary  Patient ID: BRADYN COLESTOCK MRN: [id number] DOB/AGE: 01/29/1956 61 y.o.  Admit date: 09/03/2016 Discharge date: 09/04/2016  Primary Discharge Diagnosis Bradycardia/ Sick sinus syndrome  Secondary Discharge Diagnosis same  Significant Diagnostic Studies: radiology: CXR: negative for pneumothorax   Consults: cardiology  Hospital Course: 61 year old female with a diagnosis of bradycardia and sick sinus syndrome who failed conservative measures and elected for surgical management. Patient successfully underwent dual-chamber pacemaker implantation on 09/03/2016 without pre-, peri-, or postoperative complications. Patient has ambulated this morning. She complains of pain of the left shoulder, which was present prior to the procedure, secondary to an accident.    Discharge Exam: Blood pressure 101/60, pulse 61, temperature 97.7 F (36.5 C), temperature source Oral, resp. rate 16, height 5\' 2"  (1.575 m), weight 98.4 kg (217 lb), SpO2 99 %.  General appearance: alert, cooperative, appears stated age and no distress Head: Normocephalic, without obvious abnormality, atraumatic Eyes: negative findings: pupils equal, round, reactive to light and accomodation Resp: Scant bibasilar crackles Cardio: regular rate and rhythm, S1, S2 normal, no murmur, click, rub or gallop Extremities: extremities normal, atraumatic, no cyanosis or edema Pulses: 2+ and symmetric Incision/Wound: Dried blood, no drainage, no erythema or warmth Labs:   Lab Results  Component Value Date   WBC 7.3 08/26/2016   HGB 14.8 08/26/2016   HCT 44.2 08/26/2016   MCV 93.1 08/26/2016   PLT 238 08/26/2016   No results for input(s): NA, K, CL, CO2, BUN, CREATININE, CALCIUM, PROT, BILITOT, ALKPHOS, ALT, AST, GLUCOSE in the last 168 hours.  Invalid input(s): LABALBU    Radiology: Chest xray negative for pneumothorax EKG: Atrial paced-ventricular sensed, 62 bpm  FOLLOW UP PLANS AND  APPOINTMENTS   Follow-up Information    Isaias Cowman, MD. Go in 1 week(s).   Specialty:  Cardiology Contact information: [facility] [address] [phone]           BRING ALL MEDICATIONS WITH YOU TO FOLLOW UP APPOINTMENTS  Time spent with patient to include physician time: 25 minutes Signed:  Clabe Seal PA-C 09/04/2016, 8:41 AM

## 2016-09-04 NOTE — Anesthesia Postprocedure Evaluation (Signed)
Anesthesia Post Note  Patient: Angelica Zhang  Procedure(s) Performed: Procedure(s) (LRB): INSERTION PACEMAKER (N/A)  Patient location during evaluation: PACU Anesthesia Type: General Level of consciousness: awake and alert Pain management: pain level controlled Vital Signs Assessment: post-procedure vital signs reviewed and stable Respiratory status: spontaneous breathing, nonlabored ventilation, respiratory function stable and patient connected to nasal cannula oxygen Cardiovascular status: blood pressure returned to baseline and stable Postop Assessment: no signs of nausea or vomiting Anesthetic complications: no     Last Vitals:  Vitals:   09/04/16 0502 09/04/16 1005  BP: 101/60 (!) 95/52  Pulse: 61 (!) 59  Resp: 16 18  Temp: 36.5 C 36.7 C    Last Pain:  Vitals:   09/04/16 1005  TempSrc: Oral  PainSc:                  Precious Haws Kamariyah Timberlake

## 2016-09-04 NOTE — Discharge Instructions (Signed)
For 6 weeks, avoid lifting greater than 15 pounds or raising your left arm above your head. You may shower in 24 hours, but avoid direct contact with the shower head to incision site. Leave steri-strips alone.  °

## 2016-09-04 NOTE — Progress Notes (Signed)
Arrival Method: via stretcher with PACU RN Mental Orientation: A&O Telemetry: [id number], verified by Frederico Hamman, RN Skin: surgical incision to left upper chest, s/p pacemaker placement, verified by Frederico Hamman, RN IV: 20g left forearm & 20g right forearm Pain: pain at surgical site & left shoulder, will page MD for pain medications. Safety Measures: Safety Fall Prevention Plan has been given, discussed, non skid socks in place. 2A Orientation: Patient has been orientated to the room, unit & staff.    Orders have been reviewed & implemented. Will continue to monitor the patient. Call light has been placed within reach.  Georgian Co, RN

## 2016-09-04 NOTE — Progress Notes (Signed)
Patient educated pacemaker after care. Patient verbalized concerns regarding speaking to MD before discharge. MD paged and made aware. Pt spoke to physician. VS stable.IV and tele removed. Discharge instructions along with hard copy Rx given to patient.no distress at this time, daughter at bedside. Pt transported in wheel chair.

## 2016-09-04 NOTE — Plan of Care (Signed)
Problem: Pain Managment: Goal: General experience of comfort will improve Outcome: Progressing Pt has complained of pain at surgical site radiating to left shoulder, pt has a hx of left shoulder surgery in the past, pt received percocet with relief.   Problem: Skin Integrity: Goal: Risk for impaired skin integrity will decrease Outcome: Progressing Pt has had scant drainage to the pacemaker dressing, sanguinous, marked.

## 2016-09-07 ENCOUNTER — Encounter: Payer: Self-pay | Admitting: Cardiology

## 2016-10-21 ENCOUNTER — Other Ambulatory Visit: Payer: Self-pay | Admitting: Primary Care

## 2016-10-21 DIAGNOSIS — Z0000 Encounter for general adult medical examination without abnormal findings: Secondary | ICD-10-CM

## 2016-12-17 ENCOUNTER — Ambulatory Visit
Admission: RE | Admit: 2016-12-17 | Discharge: 2016-12-17 | Disposition: A | Discharge status: Home / Self Care | Payer: Medicaid Other | Source: Ambulatory Visit | Attending: Primary Care | Admitting: Primary Care

## 2016-12-17 DIAGNOSIS — Z1231 Encounter for screening mammogram for malignant neoplasm of breast: Secondary | ICD-10-CM | POA: Insufficient documentation

## 2016-12-17 DIAGNOSIS — Z0000 Encounter for general adult medical examination without abnormal findings: Secondary | ICD-10-CM

## 2016-12-25 ENCOUNTER — Other Ambulatory Visit: Payer: Self-pay | Admitting: Primary Care

## 2016-12-25 DIAGNOSIS — R928 Other abnormal and inconclusive findings on diagnostic imaging of breast: Secondary | ICD-10-CM

## 2016-12-25 DIAGNOSIS — R921 Mammographic calcification found on diagnostic imaging of breast: Secondary | ICD-10-CM

## 2016-12-30 ENCOUNTER — Ambulatory Visit
Admission: RE | Admit: 2016-12-30 | Discharge: 2016-12-30 | Disposition: A | Discharge status: Home / Self Care | Payer: Medicaid Other | Source: Ambulatory Visit | Attending: Primary Care | Admitting: Primary Care

## 2016-12-30 DIAGNOSIS — R928 Other abnormal and inconclusive findings on diagnostic imaging of breast: Secondary | ICD-10-CM | POA: Diagnosis present

## 2016-12-30 DIAGNOSIS — R921 Mammographic calcification found on diagnostic imaging of breast: Secondary | ICD-10-CM | POA: Diagnosis present

## 2017-01-01 ENCOUNTER — Other Ambulatory Visit: Payer: Self-pay | Admitting: Primary Care

## 2017-01-01 DIAGNOSIS — R928 Other abnormal and inconclusive findings on diagnostic imaging of breast: Secondary | ICD-10-CM

## 2017-01-01 DIAGNOSIS — R921 Mammographic calcification found on diagnostic imaging of breast: Secondary | ICD-10-CM

## 2017-01-07 ENCOUNTER — Ambulatory Visit
Admission: RE | Admit: 2017-01-07 | Discharge: 2017-01-07 | Disposition: A | Discharge status: Home / Self Care | Payer: Medicaid Other | Source: Ambulatory Visit | Attending: Primary Care | Admitting: Primary Care

## 2017-01-07 DIAGNOSIS — R928 Other abnormal and inconclusive findings on diagnostic imaging of breast: Secondary | ICD-10-CM

## 2017-01-07 DIAGNOSIS — R921 Mammographic calcification found on diagnostic imaging of breast: Secondary | ICD-10-CM

## 2017-01-07 HISTORY — PX: BREAST BIOPSY: SHX20

## 2017-01-08 LAB — SURGICAL PATHOLOGY

## 2017-04-23 ENCOUNTER — Ambulatory Visit: Payer: Medicaid Other | Admitting: Anesthesiology

## 2017-04-23 ENCOUNTER — Ambulatory Visit
Admission: RE | Admit: 2017-04-23 | Discharge: 2017-04-23 | Disposition: A | Discharge status: Home / Self Care | Payer: Medicaid Other | Source: Ambulatory Visit | Attending: Gastroenterology | Admitting: Gastroenterology

## 2017-04-23 ENCOUNTER — Encounter
Admission: RE | Disposition: A | Discharge status: Home / Self Care | Payer: Self-pay | Source: Ambulatory Visit | Attending: Gastroenterology

## 2017-04-23 ENCOUNTER — Encounter: Payer: Self-pay | Admitting: *Deleted

## 2017-04-23 DIAGNOSIS — F419 Anxiety disorder, unspecified: Secondary | ICD-10-CM | POA: Insufficient documentation

## 2017-04-23 DIAGNOSIS — Z87442 Personal history of urinary calculi: Secondary | ICD-10-CM | POA: Insufficient documentation

## 2017-04-23 DIAGNOSIS — M1990 Unspecified osteoarthritis, unspecified site: Secondary | ICD-10-CM | POA: Insufficient documentation

## 2017-04-23 DIAGNOSIS — K5900 Constipation, unspecified: Secondary | ICD-10-CM | POA: Diagnosis not present

## 2017-04-23 DIAGNOSIS — K5730 Diverticulosis of large intestine without perforation or abscess without bleeding: Secondary | ICD-10-CM | POA: Insufficient documentation

## 2017-04-23 DIAGNOSIS — Z950 Presence of cardiac pacemaker: Secondary | ICD-10-CM | POA: Insufficient documentation

## 2017-04-23 DIAGNOSIS — J449 Chronic obstructive pulmonary disease, unspecified: Secondary | ICD-10-CM | POA: Insufficient documentation

## 2017-04-23 DIAGNOSIS — R194 Change in bowel habit: Secondary | ICD-10-CM | POA: Diagnosis present

## 2017-04-23 DIAGNOSIS — F329 Major depressive disorder, single episode, unspecified: Secondary | ICD-10-CM | POA: Insufficient documentation

## 2017-04-23 DIAGNOSIS — Q438 Other specified congenital malformations of intestine: Secondary | ICD-10-CM | POA: Insufficient documentation

## 2017-04-23 DIAGNOSIS — E785 Hyperlipidemia, unspecified: Secondary | ICD-10-CM | POA: Diagnosis not present

## 2017-04-23 DIAGNOSIS — Z79899 Other long term (current) drug therapy: Secondary | ICD-10-CM | POA: Diagnosis not present

## 2017-04-23 HISTORY — PX: COLONOSCOPY WITH PROPOFOL: SHX5780

## 2017-04-23 SURGERY — COLONOSCOPY WITH PROPOFOL
Anesthesia: General

## 2017-04-23 MED ORDER — PROPOFOL 500 MG/50ML IV EMUL
INTRAVENOUS | Status: DC | PRN
  Administered 2017-04-23: 160 ug/kg/min via INTRAVENOUS

## 2017-04-23 MED ORDER — MIDAZOLAM HCL 5 MG/5ML IJ SOLN
INTRAMUSCULAR | Status: DC | PRN
  Administered 2017-04-23 (×2): 1 mg via INTRAVENOUS

## 2017-04-23 MED ORDER — PHENYLEPHRINE HCL 10 MG/ML IJ SOLN
INTRAMUSCULAR | Status: DC | PRN
  Administered 2017-04-23 (×2): 100 ug via INTRAVENOUS

## 2017-04-23 MED ORDER — MIDAZOLAM HCL 2 MG/2ML IJ SOLN
INTRAMUSCULAR | Status: AC
  Filled 2017-04-23: qty 2

## 2017-04-23 MED ORDER — FENTANYL CITRATE (PF) 100 MCG/2ML IJ SOLN
INTRAMUSCULAR | Status: DC | PRN
  Administered 2017-04-23 (×2): 50 ug via INTRAVENOUS

## 2017-04-23 MED ORDER — PROPOFOL 10 MG/ML IV BOLUS
INTRAVENOUS | Status: DC | PRN
  Administered 2017-04-23: 100 mg via INTRAVENOUS

## 2017-04-23 MED ORDER — SODIUM CHLORIDE 0.9 % IV SOLN: INTRAVENOUS | Status: DC

## 2017-04-23 MED ORDER — FENTANYL CITRATE (PF) 100 MCG/2ML IJ SOLN
INTRAMUSCULAR | Status: AC
  Filled 2017-04-23: qty 2

## 2017-04-23 MED ORDER — LIDOCAINE 2% (20 MG/ML) 5 ML SYRINGE
INTRAMUSCULAR | Status: DC | PRN
  Administered 2017-04-23: 40 mg via INTRAVENOUS

## 2017-04-23 MED ORDER — SODIUM CHLORIDE 0.9 % IV SOLN
INTRAVENOUS | Status: DC
  Administered 2017-04-23 (×2): via INTRAVENOUS

## 2017-04-23 NOTE — H&P (Signed)
Outpatient short stay form Pre-procedure 04/23/2017 1:53 PM Lollie Sails MD  Primary Physician: Freddy Finner NP  Reason for visit:  Colonoscopy  History of present illness:  Patient is a 61 year old female presenting today as above. She has a history of change of bowel habits with increasing problems of constipation. She was scheduled to have a procedure a number of months ago however did have to undergo a pacemaker implant for symptomatic bradycardia. That procedure was done in January 2018. She is doing much better in that regard. She continues to have issues with constipation and taking extra doses of MiraLAX just seems to make her stools loose and more frequent over the course of the day.  She tolerated her prep well. She takes no aspirin or blood thinning agents. She does have COPD and takes multiple agents in that regard.    Current Facility-Administered Medications:  .  0.9 %  sodium chloride infusion, , Intravenous, Continuous, Lollie Sails, MD .  0.9 %  sodium chloride infusion, , Intravenous, Continuous, Lollie Sails, MD  Prescriptions Prior to Admission  Medication Sig Dispense Refill Last Dose  . ADVAIR HFA 230-21 MCG/ACT inhaler Inhale 2 puffs into the lungs 2 (two) times daily.  6 04/23/2017 at Unknown time  . albuterol (PROVENTIL HFA;VENTOLIN HFA) 108 (90 BASE) MCG/ACT inhaler Inhale 2 puffs into the lungs every 4 (four) hours as needed for wheezing or shortness of breath.    Past Month at Unknown time  . albuterol (PROVENTIL) (2.5 MG/3ML) 0.083% nebulizer solution Take 3 mLs by nebulization every 4 (four) hours as needed. For shortness of breath and wheezing.  11 Past Month at Unknown time  . ALPRAZolam (XANAX) 1 MG tablet Take 1 mg by mouth at bedtime as needed for anxiety.   04/22/2017 at Unknown time  . ATROVENT HFA 17 MCG/ACT inhaler Inhale 2 puffs into the lungs every 6 (six) hours as needed (for wheezing/COPD). For COPD.  3 04/22/2017 at Unknown time  .  cetirizine (ZYRTEC) 10 MG tablet Take 10 mg by mouth at bedtime.  7 04/22/2017 at Unknown time  . cholecalciferol (VITAMIN D) 1000 units tablet Take 1,000 Units by mouth at bedtime.   04/20/2017 at Unknown time  . ibuprofen (ADVIL,MOTRIN) 200 MG tablet Take 400-800 mg by mouth every 8 (eight) hours as needed (for pain/back pain.).   Past Week at Unknown time  . montelukast (SINGULAIR) 10 MG tablet Take 10 mg by mouth at bedtime.  5 04/22/2017 at Unknown time  . polyethylene glycol powder (GLYCOLAX/MIRALAX) powder Take 17 g by mouth daily as needed for constipation.  0 04/22/2017 at Unknown time  . cephALEXin (KEFLEX) 250 MG capsule Take 1 capsule (250 mg total) by mouth 4 (four) times daily. (Patient not taking: Reported on 04/23/2017) 28 capsule 7 Not Taking at Unknown time     No Known Allergies   Past Medical History:  Diagnosis Date  . Acute bronchitis   . Acute upper respiratory infections of unspecified site   . Anxiety   . Asthma   . Backache, unspecified   . Bradycardia   . Chronic airway obstruction, not elsewhere classified   . Depressive disorder, not elsewhere classified   . Hypotension, unspecified   . Osteoarthrosis, unspecified whether generalized or localized, other specified sites   . Other and unspecified hyperlipidemia   . Screening for diabetes mellitus   . Sinusitis   . Umbilical hernia without mention of obstruction or gangrene   . Unspecified  hypertrophic and atrophic condition of skin   . Unspecified local infection of skin and subcutaneous tissue   . Urinary calculus, unspecified     Review of systems:      Physical Exam    Heart and lungs: Regular rate and rhythm without rub or gallop, lungs are bilaterally clear.    HEENT: Normocephalic atraumatic eyes are anicteric    Other:     Pertinant exam for procedure: Soft nontender nondistended bowel sounds positive normoactive. There is a generalized discomfort. There is no mass or rebound.    Planned  proceedures: Colonoscopy and indicated procedures. I have discussed the risks benefits and complications of procedures to include not limited to bleeding, infection, perforation and the risk of sedation and the patient wishes to proceed.    Lollie Sails, MD Gastroenterology 04/23/2017  1:53 PM

## 2017-04-23 NOTE — Anesthesia Post-op Follow-up Note (Signed)
Anesthesia QCDR form completed.        

## 2017-04-23 NOTE — Anesthesia Preprocedure Evaluation (Addendum)
Anesthesia Evaluation  Patient identified by MRN, date of birth, ID band Patient awake    Reviewed: Allergy & Precautions, H&P , NPO status , Patient's Chart, lab work & pertinent test results  History of Anesthesia Complications Negative for: history of anesthetic complications  Airway Mallampati: III  TM Distance: >3 FB Neck ROM: full    Dental  (+) Poor Dentition, Chipped   Pulmonary neg shortness of breath, asthma , COPD,  COPD inhaler, former smoker,    Pulmonary exam normal breath sounds clear to auscultation       Cardiovascular Exercise Tolerance: Good Normal cardiovascular exam+ dysrhythmias + pacemaker  Rhythm:regular Rate:Normal     Neuro/Psych PSYCHIATRIC DISORDERS Anxiety Depression negative neurological ROS     GI/Hepatic negative GI ROS, Neg liver ROS, neg GERD  ,  Endo/Other  negative endocrine ROS  Renal/GU negative Renal ROS  negative genitourinary   Musculoskeletal  (+) Arthritis ,   Abdominal   Peds negative pediatric ROS (+)  Hematology negative hematology ROS (+)   Anesthesia Other Findings Past Medical History: No date: Acute bronchitis No date: Acute upper respiratory infections of unspecif* No date: Anxiety No date: Asthma No date: Backache, unspecified No date: Bradycardia No date: Chronic airway obstruction, not elsewhere clas* No date: Depressive disorder, not elsewhere classified  [employer] dual chamber pacemaker No date: Hypotension, unspecified No date: Osteoarthrosis, unspecified whether generalize* No date: Other and unspecified hyperlipidemia No date: Screening for diabetes mellitus No date: Sinusitis No date: Umbilical hernia without mention of obstructio* No date: Unspecified hypertrophic and atrophic conditio* No date: Unspecified local infection of skin and subcut* No date: Urinary calculus, unspecified  Past Surgical History: No date: BACK SURGERY 09/20/2009:  CARDIAC CATHETERIZATION     Comment: Sumner;Arida No date: CHOLECYSTECTOMY No date: HERNIA REPAIR     Comment: x 2 No date: ROTATOR CUFF REPAIR  BMI    Body Mass Index:  39.69 kg/m      Reproductive/Obstetrics negative OB ROS                            Anesthesia Physical  Anesthesia Plan  ASA: III  Anesthesia Plan: General   Post-op Pain Management:    Induction: Intravenous  PONV Risk Score and Plan:   Airway Management Planned: Nasal Cannula  Additional Equipment:   Intra-op Plan:   Post-operative Plan:   Informed Consent: I have reviewed the patients History and Physical, chart, labs and discussed the procedure including the risks, benefits and alternatives for the proposed anesthesia with the patient or authorized representative who has indicated his/her understanding and acceptance.   Dental Advisory Given and Dental advisory given  Plan Discussed with: Anesthesiologist, CRNA and Surgeon  Anesthesia Plan Comments:         Anesthesia Quick Evaluation

## 2017-04-23 NOTE — Op Note (Addendum)
[facility] Patient Name: Angelica Zhang Procedure Date: 04/23/2017 1:58 PM MRN: [id number] Account #: [id number] Date of Birth: 04-17-1956 Admit Type: Outpatient Age: 61 Room: [facility] ENDO ROOM 1 Gender: Female Note Status: Finalized Procedure:            Colonoscopy Indications:          Change in bowel habits, Constipation Providers:            Lollie Sails, MD Referring MD:         Neomia Dear (Referring MD) Medicines:            Monitored Anesthesia Care Complications:        No immediate complications. Procedure:            Pre-Anesthesia Assessment:                       - ASA Grade Assessment: III - A patient with severe                        systemic disease.                       After obtaining informed consent, the colonoscope was                        passed under direct vision. Throughout the procedure,                        the patient's blood pressure, pulse, and oxygen                        saturations were monitored continuously. The                        Colonoscope was introduced through the anus and                        advanced to the the cecum, identified by appendiceal                        orifice and ileocecal valve. The colonoscopy was                        performed with moderate difficulty due to a tortuous                        colon. Successful completion of the procedure was aided                        by changing the patient to a supine position. The                        patient tolerated the procedure well. The quality of                        the bowel preparation was fair. Findings:      A 4 mm polyp was found in the rectum. The polyp was sessile. The polyp       was removed with a cold snare. Resection and retrieval were complete.      Two sessile  polyps were found in the cecum. The polyps were 2 to 5 mm in       size. These polyps were removed with a cold snare. Resection and   retrieval were complete.      Biopsies for histology were taken with a cold forceps from the right       colon and left colon for evaluation of microscopic colitis.      A 2 mm polyp was found in the proximal sigmoid colon. The polyp was       sessile. The polyp was removed with a cold biopsy forceps. Resection and       retrieval were complete.      The digital rectal exam was normal.      Multiple medium-mouthed diverticula were found in the sigmoid colon and       distal descending colon. Impression:           - Preparation of the colon was fair.                       - One 4 mm polyp in the rectum, removed with a cold                        snare. Resected and retrieved.                       - Two 2 to 5 mm polyps in the cecum, removed with a                        cold snare. Resected and retrieved.                       - One 2 mm polyp in the proximal sigmoid colon, removed                        with a cold biopsy forceps. Resected and retrieved.                       - Biopsies were taken with a cold forceps from the                        right colon and left colon for evaluation of                        microscopic colitis. Recommendation:       - Discharge patient to home.                       - Use Citrucel one tablespoon PO daily daily.                       - Continue present medications.                       - Return to GI clinic in 3 weeks. Procedure Code(s):    --- Professional ---                       [phone], Colonoscopy, flexible; with removal of tumor(s),  polyp(s), or other lesion(s) by snare technique                       [id number], 59, Colonoscopy, flexible; with biopsy, single                        or multiple Diagnosis Code(s):    --- Professional ---                       K62.1, Rectal polyp                       D12.5, Benign neoplasm of sigmoid colon                       D12.0, Benign neoplasm of cecum                       R19.4,  Change in bowel habit                       K59.00, Constipation, unspecified CPT copyright 2016 American Medical Association. All rights reserved. The codes documented in this report are preliminary and upon coder review may  be revised to meet current compliance requirements. Lollie Sails, MD 04/23/2017 3:05:34 PM This report has been signed electronically. Number of Addenda: 0 Note Initiated On: 04/23/2017 1:58 PM Scope Withdrawal Time: 0 hours 19 minutes 12 seconds  Total Procedure Duration: 0 hours 39 minutes 46 seconds       [facility]

## 2017-04-23 NOTE — Transfer of Care (Signed)
Immediate Anesthesia Transfer of Care Note  Patient: Angelica Zhang  Procedure(s) Performed: Procedure(s): COLONOSCOPY WITH PROPOFOL (N/A)  Patient Location: PACU and Endoscopy Unit  Anesthesia Type:General  Level of Consciousness: sedated  Airway & Oxygen Therapy: Patient Spontanous Breathing and Patient connected to nasal cannula oxygen  Post-op Assessment: Report given to RN and Post -op Vital signs reviewed and stable  Post vital signs: Reviewed and stable  Last Vitals:  Vitals:   04/23/17 1350  BP: 117/79  Pulse: 89  Resp: 20  Temp: 36.6 C  SpO2: 100%    Last Pain:  Vitals:   04/23/17 1350  TempSrc: Tympanic  PainSc: 7       Patients Stated Pain Goal: 0 (50/51/83 3582)  Complications: No apparent anesthesia complications

## 2017-04-24 ENCOUNTER — Encounter: Payer: Self-pay | Admitting: Gastroenterology

## 2017-04-26 NOTE — Anesthesia Postprocedure Evaluation (Signed)
Anesthesia Post Note  Patient: Angelica Zhang  Procedure(s) Performed: Procedure(s) (LRB): COLONOSCOPY WITH PROPOFOL (N/A)  Patient location during evaluation: Endoscopy Anesthesia Type: General Level of consciousness: awake and alert Pain management: pain level controlled Vital Signs Assessment: post-procedure vital signs reviewed and stable Respiratory status: spontaneous breathing, nonlabored ventilation, respiratory function stable and patient connected to nasal cannula oxygen Cardiovascular status: blood pressure returned to baseline and stable Postop Assessment: no apparent nausea or vomiting Anesthetic complications: no     Last Vitals:  Vitals:   04/23/17 1517 04/23/17 1537  BP: 117/84 98/82  Pulse:    Resp:    Temp:    SpO2:      Last Pain:  Vitals:   04/23/17 1507  TempSrc: Tympanic  PainSc:                  Precious Haws Gilma Bessette

## 2017-04-28 LAB — SURGICAL PATHOLOGY

## 2017-05-28 ENCOUNTER — Other Ambulatory Visit: Payer: Self-pay | Admitting: Gastroenterology

## 2017-05-28 DIAGNOSIS — R1031 Right lower quadrant pain: Secondary | ICD-10-CM

## 2017-05-28 DIAGNOSIS — R1032 Left lower quadrant pain: Secondary | ICD-10-CM

## 2017-06-02 ENCOUNTER — Ambulatory Visit
Admission: RE | Admit: 2017-06-02 | Discharge: 2017-06-02 | Disposition: A | Discharge status: Home / Self Care | Payer: Medicaid Other | Source: Ambulatory Visit | Attending: Gastroenterology | Admitting: Gastroenterology

## 2017-06-02 DIAGNOSIS — R1032 Left lower quadrant pain: Secondary | ICD-10-CM | POA: Insufficient documentation

## 2017-06-02 DIAGNOSIS — R1031 Right lower quadrant pain: Secondary | ICD-10-CM

## 2017-06-27 ENCOUNTER — Emergency Department: Payer: Medicaid Other

## 2017-06-27 ENCOUNTER — Emergency Department
Admission: EM | Admit: 2017-06-27 | Discharge: 2017-06-27 | Disposition: A | Discharge status: Home / Self Care | Payer: Medicaid Other | Attending: Emergency Medicine | Admitting: Emergency Medicine

## 2017-06-27 ENCOUNTER — Other Ambulatory Visit: Payer: Self-pay

## 2017-06-27 ENCOUNTER — Encounter: Payer: Self-pay | Admitting: Emergency Medicine

## 2017-06-27 DIAGNOSIS — S39012A Strain of muscle, fascia and tendon of lower back, initial encounter: Secondary | ICD-10-CM | POA: Diagnosis not present

## 2017-06-27 DIAGNOSIS — Y9389 Activity, other specified: Secondary | ICD-10-CM | POA: Insufficient documentation

## 2017-06-27 DIAGNOSIS — S3992XA Unspecified injury of lower back, initial encounter: Secondary | ICD-10-CM | POA: Diagnosis present

## 2017-06-27 DIAGNOSIS — Z87891 Personal history of nicotine dependence: Secondary | ICD-10-CM | POA: Insufficient documentation

## 2017-06-27 DIAGNOSIS — Z79899 Other long term (current) drug therapy: Secondary | ICD-10-CM | POA: Diagnosis not present

## 2017-06-27 DIAGNOSIS — W01198A Fall on same level from slipping, tripping and stumbling with subsequent striking against other object, initial encounter: Secondary | ICD-10-CM | POA: Diagnosis not present

## 2017-06-27 DIAGNOSIS — M5441 Lumbago with sciatica, right side: Secondary | ICD-10-CM | POA: Diagnosis not present

## 2017-06-27 DIAGNOSIS — Y999 Unspecified external cause status: Secondary | ICD-10-CM | POA: Diagnosis not present

## 2017-06-27 DIAGNOSIS — Y929 Unspecified place or not applicable: Secondary | ICD-10-CM | POA: Insufficient documentation

## 2017-06-27 DIAGNOSIS — M79604 Pain in right leg: Secondary | ICD-10-CM | POA: Insufficient documentation

## 2017-06-27 DIAGNOSIS — R200 Anesthesia of skin: Secondary | ICD-10-CM | POA: Diagnosis not present

## 2017-06-27 DIAGNOSIS — J45909 Unspecified asthma, uncomplicated: Secondary | ICD-10-CM | POA: Insufficient documentation

## 2017-06-27 MED ORDER — MELOXICAM 15 MG PO TABS: 15.0000 mg | ORAL_TABLET | Freq: Every day | ORAL | 0 refills | Status: DC

## 2017-06-27 MED ORDER — CYCLOBENZAPRINE HCL 5 MG PO TABS: 5.0000 mg | ORAL_TABLET | Freq: Three times a day (TID) | ORAL | 0 refills | Status: DC | PRN

## 2017-06-27 MED ORDER — KETOROLAC TROMETHAMINE 30 MG/ML IJ SOLN
30.0000 mg | Freq: Once | INTRAMUSCULAR | Status: AC
  Administered 2017-06-27: 30 mg via INTRAMUSCULAR
  Filled 2017-06-27: qty 1

## 2017-06-27 NOTE — ED Notes (Signed)
Patient transported to X-ray 

## 2017-06-27 NOTE — Discharge Instructions (Signed)
Please take meloxicam daily for 7-10 days with food.  Take muscle relaxers as needed.  Avoid taking any ibuprofen or Aleve.  You may use Tylenol as needed.  Follow-up with primary care provider if no improvement in 5-7 days.  Return to the ED for any worsening symptoms or  urgent changes in your health.

## 2017-06-27 NOTE — ED Provider Notes (Signed)
[facility] EMERGENCY DEPARTMENT Provider Note   CSN: [id number] Arrival date & time: 06/27/17  1424     History   Chief Complaint Chief Complaint  Patient presents with  . Back Pain    HPI Angelica Zhang is a 61 y.o. female presents to the emergency department for evaluation of a fall that occurred around 12:30 PM today.  Patient states she slipped, fell forward on her knees and hands.  She felt some pain in her lower back and since the injury she has had increasing pain numbness and tingling in the right calf.  Pain is constant.  Patient denies hitting her head, losing consciousness.  She denies any other pain throughout her body.  She states she does not think she fractured anything but is concerned about her lumbar fusion.  She denies any saddle anesthesia or loss of bowel or bladder symptoms.  Her pain is mild to moderate and increased with standing.  She has had ibuprofen with no improvement.  HPI  Past Medical History:  Diagnosis Date  . Acute bronchitis   . Acute upper respiratory infections of unspecified site   . Anxiety   . Asthma   . Backache, unspecified   . Bradycardia   . Chronic airway obstruction, not elsewhere classified   . Depressive disorder, not elsewhere classified   . Hypotension, unspecified   . Osteoarthrosis, unspecified whether generalized or localized, other specified sites   . Other and unspecified hyperlipidemia   . Screening for diabetes mellitus   . Sinusitis   . Umbilical hernia without mention of obstruction or gangrene   . Unspecified hypertrophic and atrophic condition of skin   . Unspecified local infection of skin and subcutaneous tissue   . Urinary calculus, unspecified     Patient Active Problem List   Diagnosis Date Noted  . Sick sinus syndrome ([place]) 09/03/2016  . Bradycardia 09/20/2012  . Dizziness 09/20/2012  . Morbid obesity ([place]) 09/20/2012  . Smoking history 09/20/2012    Past Surgical History:    Procedure Laterality Date  . BACK SURGERY    . BREAST BIOPSY Left 01/07/2017   Affirm Bx- path pending  . CARDIAC CATHETERIZATION  09/20/2009   [facility]  . CHOLECYSTECTOMY    . COLONOSCOPY WITH PROPOFOL N/A 04/23/2017   Performed by Lollie Sails, MD at [facility]  . HERNIA REPAIR     x 2  . INSERTION PACEMAKER N/A 09/03/2016   Performed by Isaias Cowman, MD at [facility]  . ROTATOR CUFF REPAIR      OB History    No data available       Home Medications    Prior to Admission medications   Medication Sig Start Date End Date Taking? Authorizing Provider  ADVAIR HFA 230-21 MCG/ACT inhaler Inhale 2 puffs into the lungs 2 (two) times daily. 04/12/15   [provider]  albuterol (PROVENTIL HFA;VENTOLIN HFA) 108 (90 BASE) MCG/ACT inhaler Inhale 2 puffs into the lungs every 4 (four) hours as needed for wheezing or shortness of breath.     [provider]  albuterol (PROVENTIL) (2.5 MG/3ML) 0.083% nebulizer solution Take 3 mLs by nebulization every 4 (four) hours as needed. For shortness of breath and wheezing. 04/12/15   [provider]  ALPRAZolam Duanne Moron) 1 MG tablet Take 1 mg by mouth at bedtime as needed for anxiety.    [provider]  ATROVENT HFA 17 MCG/ACT inhaler Inhale 2 puffs into the lungs every 6 (six)  hours as needed (for wheezing/COPD). For COPD. 04/12/15   [provider]  cephALEXin (KEFLEX) 250 MG capsule Take 1 capsule (250 mg total) by mouth 4 (four) times daily. Patient not taking: Reported on 04/23/2017 09/04/16   Clabe Seal, PA-C  cetirizine (ZYRTEC) 10 MG tablet Take 10 mg by mouth at bedtime. 08/12/16   [provider]  cholecalciferol (VITAMIN D) 1000 units tablet Take 1,000 Units by mouth at bedtime.    [provider]  cyclobenzaprine (FLEXERIL) 5 MG tablet Take 1-2 tablets (5-10 mg total) 3 (three) times daily as needed by mouth for muscle spasms. 06/27/17   Duanne Guess, PA-C  meloxicam  (MOBIC) 15 MG tablet Take 1 tablet (15 mg total) daily by mouth. 06/27/17   Duanne Guess, PA-C  montelukast (SINGULAIR) 10 MG tablet Take 10 mg by mouth at bedtime. 03/17/15   [provider]  polyethylene glycol powder (GLYCOLAX/MIRALAX) powder Take 17 g by mouth daily as needed for constipation. 06/26/16   [provider]    Family History Family History  Problem Relation Age of Onset  . Heart attack Mother   . Breast cancer Neg Hx     Social History Social History   Tobacco Use  . Smoking status: Former Smoker    Packs/day: 1.00    Years: 40.00    Pack years: 40.00    Types: Cigarettes  . Smokeless tobacco: Never Used  Substance Use Topics  . Alcohol use: No  . Drug use: No    Comment: past     Allergies   Patient has no known allergies.   Review of Systems Review of Systems  Constitutional: Negative for activity change.  Eyes: Negative for pain and visual disturbance.  Respiratory: Negative for shortness of breath.   Cardiovascular: Negative for chest pain and leg swelling.  Gastrointestinal: Negative for abdominal pain.  Genitourinary: Negative for flank pain and pelvic pain.  Musculoskeletal: Positive for arthralgias. Negative for gait problem, joint swelling, myalgias, neck pain and neck stiffness.  Skin: Negative for wound.  Neurological: Positive for numbness. Negative for dizziness, syncope, weakness, light-headedness and headaches.  Psychiatric/Behavioral: Negative for confusion and decreased concentration.     Physical Exam Updated Vital Signs BP 130/78   Pulse 86   Temp 97.9 F (36.6 C) (Oral)   Resp 18   Ht 5\' 2"  (1.575 m)   Wt 89.8 kg (198 lb)   SpO2 95%   BMI 36.21 kg/m   Physical Exam  Constitutional: She is oriented to person, place, and time. She appears well-developed and well-nourished.  HENT:  Head: Normocephalic and atraumatic.  Right Ear: External ear normal.  Left Ear: External ear normal.  Nose: Nose  normal.  Eyes: Conjunctivae and EOM are normal. Pupils are equal, round, and reactive to light.  Neck: Normal range of motion.  Cardiovascular: Normal rate.  Pulmonary/Chest: Effort normal and breath sounds normal. No respiratory distress.  Abdominal: Soft. There is no tenderness.  Musculoskeletal:  Examination of the cervical thoracic and lumbar spine shows no spinous process tenderness.  There is mild paravertebral muscle tenderness along the lumbosacral junction.  She has good range of motion of the spine.  No warmth erythema or drainage.  Along the incision site.  Incision site completely healed.  Patient has full range of motion of bilateral hips with no discomfort.  Sensation is intact throughout bilateral lower extremities.  She is able to straight leg raise bilaterally.  There is no swelling or ecchymosis  noted throughout the lower extremities.  Knees are stable to valgus and varus stress testing.  She has full range of motion of the upper extremities with no tenderness to palpation of the upper extremities.  Neurological: She is alert and oriented to person, place, and time. No cranial nerve deficit. Coordination normal.  Skin: Skin is warm and dry. No rash noted.  Psychiatric: She has a normal mood and affect. Her behavior is normal.     ED Treatments / Results  Labs (all labs ordered are listed, but only abnormal results are displayed) Labs Reviewed - No data to display  EKG  EKG Interpretation None       Radiology Dg Lumbar Spine 2-3 Views  Result Date: 06/27/2017 CLINICAL DATA:  Low back pain, fall EXAM: LUMBAR SPINE - 2-3 VIEW COMPARISON:  CT 03/20/2016 FINDINGS: Prior posterior fusion L4-S1.  No malalignment.  No fracture. IMPRESSION: No acute bony abnormality.  Prior posterior fusion L4-S1. Electronically Signed   By: Rolm Baptise M.D.   On: 06/27/2017 16:04    Procedures Procedures (including critical care time)  Medications Ordered in ED Medications  ketorolac  (TORADOL) 30 MG/ML injection 30 mg (30 mg Intramuscular Given 06/27/17 1554)     Initial Impression / Assessment and Plan / ED Course  I have reviewed the triage vital signs and the nursing notes.  Pertinent labs & imaging results that were available during my care of the patient were reviewed by me and considered in my medical decision making (see chart for details).     61 year old female with history of lower lumbar fusion.  She had been doing well prior to her fall earlier today.  Patient fell forward causing mild pain along the paravertebral muscles of her lumbar spine with some radicular pain in her right lower leg.  No weakness or neurological deficits.  She is ambulatory with no assistive devices.  Pain is mild to moderate.  X-ray showed no evidence of acute bony normality or disruption of the hardware.  She is given a shot of Toradol today in the ER.  She is given a prescription for meloxicam as well as Flexeril to take at home.  She will follow-up with primary care provider or orthopedics if no improvement in 5-7 days.  Final Clinical Impressions(s) / ED Diagnoses   Final diagnoses:  Acute midline low back pain with right-sided sciatica  Strain of lumbar region, initial encounter    ED Discharge Orders        Ordered    meloxicam (MOBIC) 15 MG tablet  Daily     06/27/17 1618    cyclobenzaprine (FLEXERIL) 5 MG tablet  3 times daily PRN     06/27/17 1618       Duanne Guess, PA-C 06/27/17 1623    Orbie Pyo, MD 06/28/17 [phone]

## 2017-06-27 NOTE — ED Triage Notes (Signed)
Fell 1230 today, low back pain.

## 2018-05-31 ENCOUNTER — Other Ambulatory Visit: Payer: Self-pay | Admitting: Family Medicine

## 2018-05-31 DIAGNOSIS — Z1231 Encounter for screening mammogram for malignant neoplasm of breast: Secondary | ICD-10-CM

## 2018-06-24 ENCOUNTER — Ambulatory Visit
Admission: RE | Admit: 2018-06-24 | Discharge: 2018-06-24 | Disposition: A | Discharge status: Home / Self Care | Payer: Medicaid Other | Source: Ambulatory Visit | Attending: Family Medicine | Admitting: Family Medicine

## 2018-06-24 DIAGNOSIS — Z1231 Encounter for screening mammogram for malignant neoplasm of breast: Secondary | ICD-10-CM | POA: Insufficient documentation

## 2019-08-01 IMAGING — US US PELVIS COMPLETE TRANSABD/TRANSVAG
1 series · 14 of 25 positions shown · non-contrast
Comparison: None

CLINICAL DATA: Initial evaluation for bilateral pelvic pain for 1
year

EXAM:
TRANSABDOMINAL AND TRANSVAGINAL ULTRASOUND OF PELVIS
TECHNIQUE: Both transabdominal and transvaginal ultrasound examinations of the
pelvis were performed. Transabdominal technique was performed for
global imaging of the pelvis including uterus, ovaries, adnexal
regions, and pelvic cul-de-sac. It was necessary to proceed with
endovaginal exam following the transabdominal exam to visualize the
uterus and ovaries.

[Series 1: us pelvis complete transabd/transvag · 0.26mm/px · 14 of 86 slices shown]
[im 1/86]
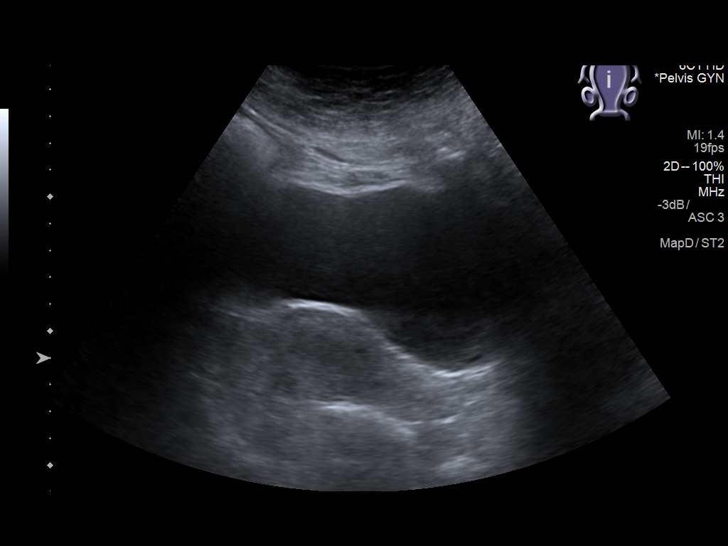
[im 8/86]
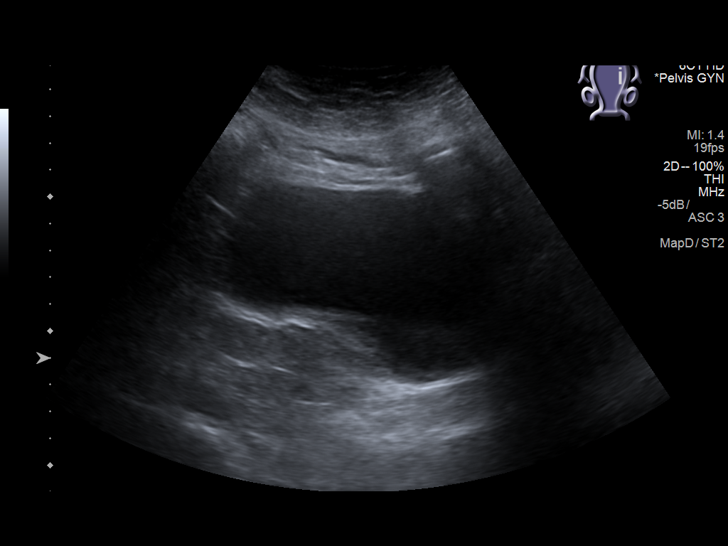
[im 15/86]
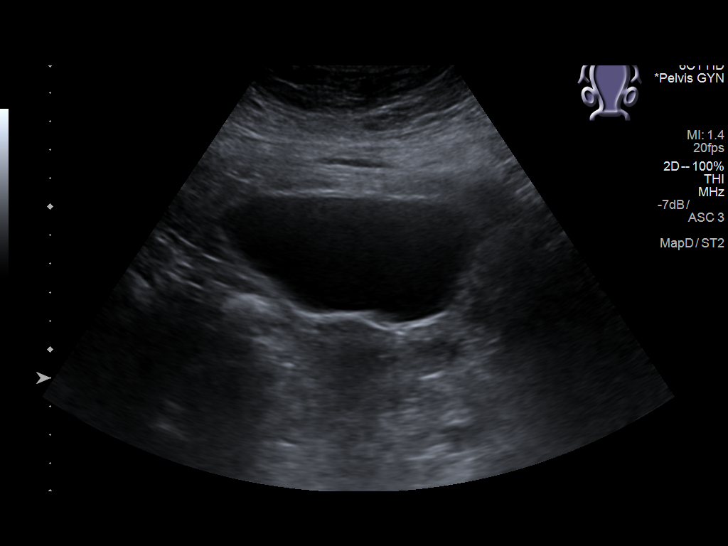
[im 22/86]
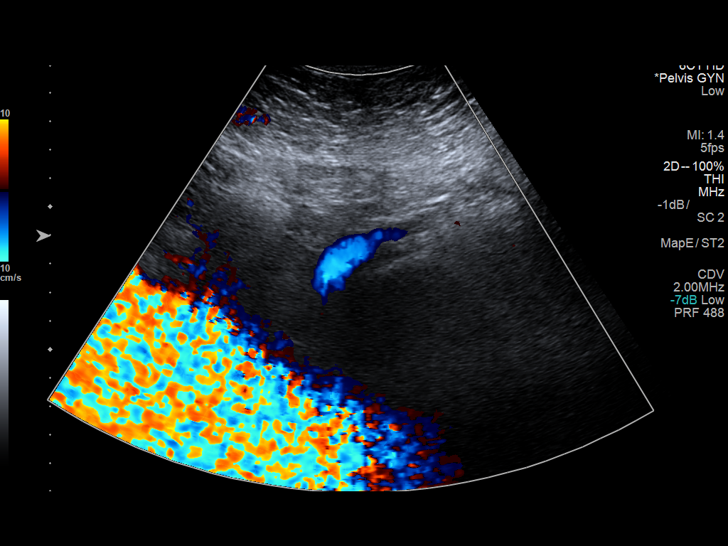
[im 29/86]
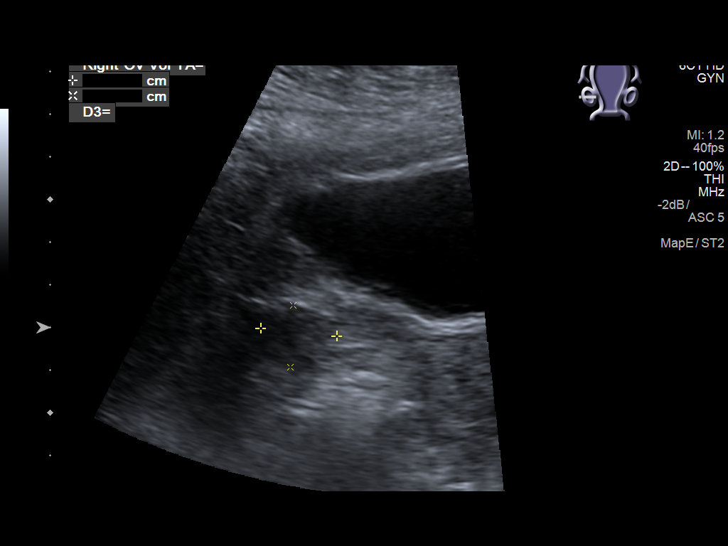
[im 32/86]
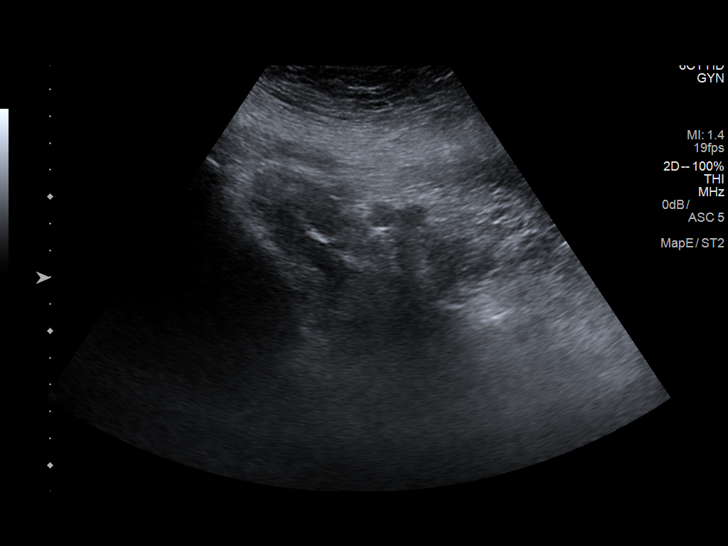
[im 39/86]
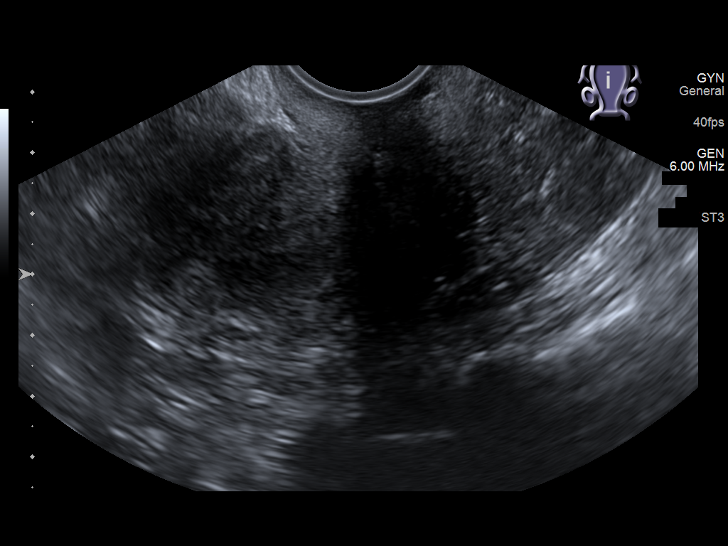
[im 47/86]
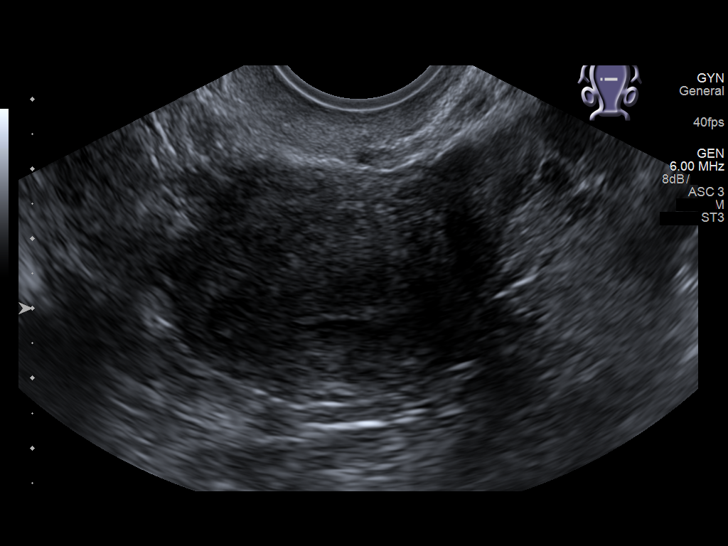
[im 54/86]
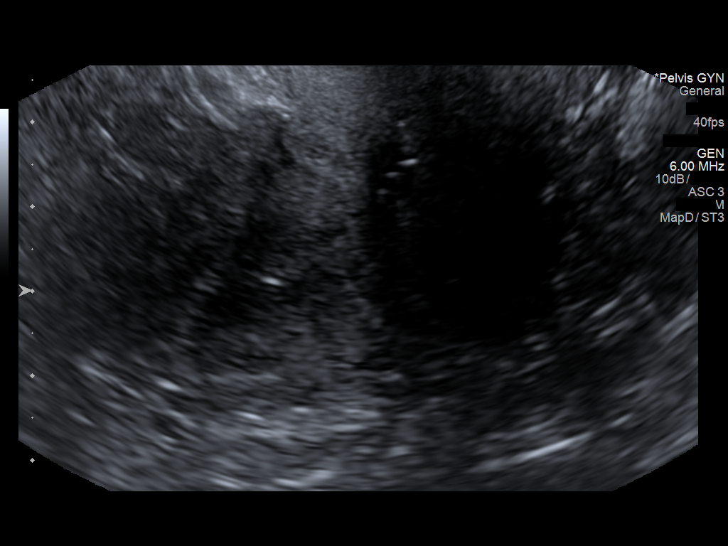
[im 57/86]
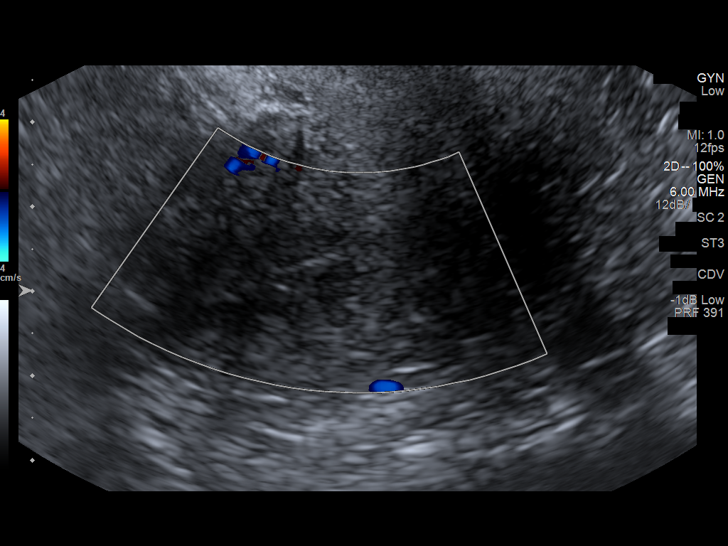
[im 64/86]
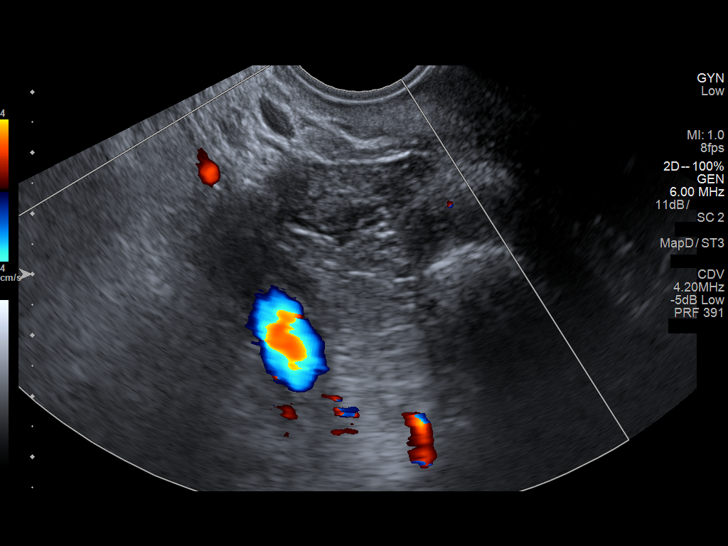
[im 71/86]
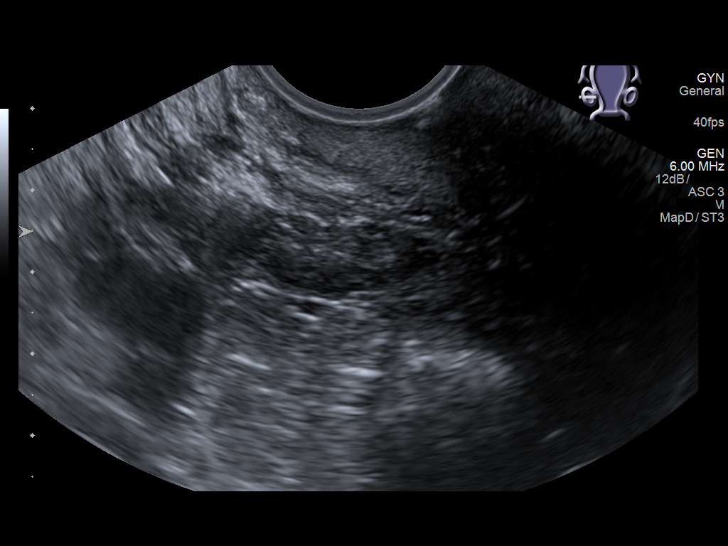
[im 78/86]
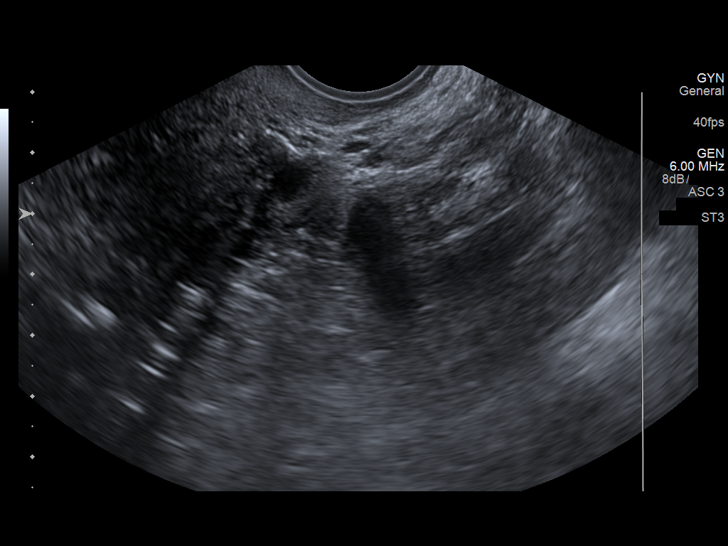
[im 86/86]
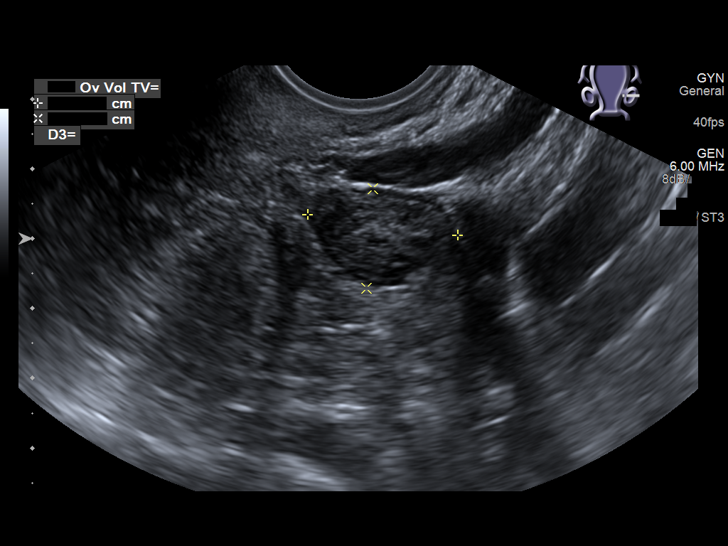

[14 of 25 positions shown; findings below may reference images not displayed]

FINDINGS: Uterus

Measurements: 7.4 x 3.4 x 4.3 cm. No fibroids or other mass
visualized. Single tiny 2 mm calcification noted at the cervix, of
doubtful significance.

Endometrium

Thickness: 4 mm.  No focal abnormality visualized.

Right ovary

Measurements: 2.5 x 1.2 x 2.0 cm. Normal appearance/no adnexal mass.

Left ovary

Measurements: 2.2 x 1.4 x 1.8 cm. Normal appearance/no adnexal mass.

Other findings

No abnormal free fluid.
IMPRESSION: Normal pelvic ultrasound.

## 2019-08-31 ENCOUNTER — Other Ambulatory Visit: Payer: Self-pay | Admitting: Family Medicine

## 2019-08-31 DIAGNOSIS — Z1231 Encounter for screening mammogram for malignant neoplasm of breast: Secondary | ICD-10-CM

## 2019-09-20 ENCOUNTER — Ambulatory Visit
Admission: RE | Admit: 2019-09-20 | Discharge: 2019-09-20 | Disposition: A | Discharge status: Home / Self Care | Payer: Medicaid Other | Source: Ambulatory Visit | Attending: Family Medicine | Admitting: Family Medicine

## 2019-09-20 DIAGNOSIS — Z1231 Encounter for screening mammogram for malignant neoplasm of breast: Secondary | ICD-10-CM | POA: Insufficient documentation

## 2020-06-27 ENCOUNTER — Emergency Department: Payer: Medicaid Other

## 2020-06-27 ENCOUNTER — Other Ambulatory Visit: Payer: Self-pay

## 2020-06-27 ENCOUNTER — Emergency Department
Admission: EM | Admit: 2020-06-27 | Discharge: 2020-06-27 | Disposition: A | Discharge status: Home / Self Care | Payer: Medicaid Other | Attending: Emergency Medicine | Admitting: Emergency Medicine

## 2020-06-27 DIAGNOSIS — E041 Nontoxic single thyroid nodule: Secondary | ICD-10-CM | POA: Diagnosis not present

## 2020-06-27 DIAGNOSIS — Z950 Presence of cardiac pacemaker: Secondary | ICD-10-CM | POA: Diagnosis not present

## 2020-06-27 DIAGNOSIS — Z20822 Contact with and (suspected) exposure to covid-19: Secondary | ICD-10-CM | POA: Insufficient documentation

## 2020-06-27 DIAGNOSIS — R059 Cough, unspecified: Secondary | ICD-10-CM | POA: Insufficient documentation

## 2020-06-27 DIAGNOSIS — J329 Chronic sinusitis, unspecified: Secondary | ICD-10-CM | POA: Diagnosis not present

## 2020-06-27 DIAGNOSIS — R0602 Shortness of breath: Secondary | ICD-10-CM | POA: Diagnosis present

## 2020-06-27 DIAGNOSIS — I10 Essential (primary) hypertension: Secondary | ICD-10-CM | POA: Insufficient documentation

## 2020-06-27 DIAGNOSIS — R911 Solitary pulmonary nodule: Secondary | ICD-10-CM | POA: Diagnosis not present

## 2020-06-27 DIAGNOSIS — J449 Chronic obstructive pulmonary disease, unspecified: Secondary | ICD-10-CM | POA: Insufficient documentation

## 2020-06-27 DIAGNOSIS — J45909 Unspecified asthma, uncomplicated: Secondary | ICD-10-CM | POA: Insufficient documentation

## 2020-06-27 DIAGNOSIS — R079 Chest pain, unspecified: Secondary | ICD-10-CM | POA: Insufficient documentation

## 2020-06-27 DIAGNOSIS — Z87891 Personal history of nicotine dependence: Secondary | ICD-10-CM | POA: Diagnosis not present

## 2020-06-27 LAB — BASIC METABOLIC PANEL
Anion gap: 11 (ref 5–15)
BUN: 18 mg/dL (ref 8–23)
CO2: 25 mmol/L (ref 22–32)
Calcium: 9.5 mg/dL (ref 8.9–10.3)
Chloride: 102 mmol/L (ref 98–111)
Creatinine, Ser: 0.92 mg/dL (ref 0.44–1.00)
GFR, Estimated: >60 mL/min (ref >60)
Glucose, Bld: 118 mg/dL — ABNORMAL HIGH (ref 70–99)
Potassium: 3.9 mmol/L (ref 3.5–5.1)
Sodium: 138 mmol/L (ref 135–145)

## 2020-06-27 LAB — CBC
HCT: 46.9 % — ABNORMAL HIGH (ref 36.0–46.0)
Hemoglobin: 15.7 g/dL — ABNORMAL HIGH (ref 12.0–15.0)
MCH: 31.5 pg (ref 26.0–34.0)
MCHC: 33.5 g/dL (ref 30.0–36.0)
MCV: 94.2 fL (ref 80.0–100.0)
Platelets: 183 10*3/uL (ref 150–400)
RBC: 4.98 MIL/uL (ref 3.87–5.11)
RDW: 11.9 % (ref 11.5–15.5)
WBC: 8 10*3/uL (ref 4.0–10.5)
nRBC: 0 % (ref 0.0–0.2)

## 2020-06-27 LAB — RESPIRATORY PANEL BY RT PCR (FLU A&B, COVID)
Influenza A by PCR: NEGATIVE
Influenza B by PCR: NEGATIVE
SARS Coronavirus 2 by RT PCR: NEGATIVE

## 2020-06-27 LAB — TROPONIN I (HIGH SENSITIVITY)
Troponin I (High Sensitivity): 3 ng/L (ref <18)
Troponin I (High Sensitivity): 3 ng/L (ref <18)

## 2020-06-27 MED ORDER — OMEPRAZOLE 20 MG PO CPDR: 20.0000 mg | DELAYED_RELEASE_CAPSULE | Freq: Every day | ORAL | 1 refills | Status: DC

## 2020-06-27 MED ORDER — IOHEXOL 350 MG/ML SOLN
75.0000 mL | Freq: Once | INTRAVENOUS | Status: AC | PRN
  Administered 2020-06-27: 75 mL via INTRAVENOUS

## 2020-06-27 NOTE — ED Triage Notes (Signed)
Reports SOB over last week and recurrent URI for last few months. Seen by her PCP and sent to ED for further eval. Pt talks in full and complete sentences without difficulties.

## 2020-06-27 NOTE — Discharge Instructions (Signed)
Please schedule follow-up with ENT regarding your sinus symptoms and difficulty breathing.  Please also schedule follow-up with your PCP for thyroid ultrasound and CT scan of pulmonary nodule in 1 year.  Please return to the ER for any new or worsening symptoms.

## 2020-06-27 NOTE — ED Provider Notes (Signed)
[facility] Emergency Department Provider Note   ____________________________________________   First MD Initiated Contact with Patient 06/27/20 1504     (approximate)  I have reviewed the triage vital signs and the nursing notes.   HISTORY  Chief Complaint Shortness of Breath    HPI Angelica Zhang is a 64 y.o. female with possible history of hypertension, hyperlipidemia, COPD, GERD, SA dysfunction status post pacemaker who presents to the ED complaining of shortness of breath and chest pain.  Patient reports that she has been dealing with multiple months of frontal sinus pain with clear drainage.  She has been through multiple antibiotic courses as well as courses of steroids with no relief.  She has been working with her PCP to establish care with ENT, but over the past couple of days developed sharp pain in the center of her chest.  She describes it as intermittent and associated with a deep breath.  She has had a nonproductive cough he denies any fevers.  She has not noticed any swelling in her legs and denies any history of DVT/PE.  She has been taking her decongestant medications as well as inhalers without significant relief.  She states she was sent to the ED by her PCP for further imaging of her lungs.        Past Medical History:  Diagnosis Date  . Acute bronchitis   . Acute upper respiratory infections of unspecified site   . Anxiety   . Asthma   . Backache, unspecified   . Bradycardia   . Chronic airway obstruction, not elsewhere classified   . Depressive disorder, not elsewhere classified   . Hypotension, unspecified   . Osteoarthrosis, unspecified whether generalized or localized, other specified sites   . Other and unspecified hyperlipidemia   . Screening for diabetes mellitus   . Sinusitis   . Umbilical hernia without mention of obstruction or gangrene   . Unspecified hypertrophic and atrophic condition of skin   . Unspecified local  infection of skin and subcutaneous tissue   . Urinary calculus, unspecified     Patient Active Problem List   Diagnosis Date Noted  . Sick sinus syndrome ([place]) 09/03/2016  . Bradycardia 09/20/2012  . Dizziness 09/20/2012  . Morbid obesity ([place]) 09/20/2012  . Smoking history 09/20/2012    Past Surgical History:  Procedure Laterality Date  . BACK SURGERY    . BREAST BIOPSY Left 01/07/2017   Affirm Bx- neg  . CARDIAC CATHETERIZATION  09/20/2009   [facility]  . CHOLECYSTECTOMY    . COLONOSCOPY WITH PROPOFOL N/A 04/23/2017   Procedure: COLONOSCOPY WITH PROPOFOL;  Surgeon: Lollie Sails, MD;  Location: [facility] ENDOSCOPY;  Service: Endoscopy;  Laterality: N/A;  . HERNIA REPAIR     x 2  . PACEMAKER INSERTION N/A 09/03/2016   Procedure: INSERTION PACEMAKER;  Surgeon: Isaias Cowman, MD;  Location: [facility] ORS;  Service: Cardiovascular;  Laterality: N/A;  . ROTATOR CUFF REPAIR      Prior to Admission medications   Medication Sig Start Date End Date Taking? Authorizing Provider  ADVAIR HFA 230-21 MCG/ACT inhaler Inhale 2 puffs into the lungs 2 (two) times daily. 04/12/15   [provider]  albuterol (PROVENTIL HFA;VENTOLIN HFA) 108 (90 BASE) MCG/ACT inhaler Inhale 2 puffs into the lungs every 4 (four) hours as needed for wheezing or shortness of breath.     [provider]  albuterol (PROVENTIL) (2.5 MG/3ML) 0.083% nebulizer solution Take 3 mLs by nebulization every 4 (four)  hours as needed. For shortness of breath and wheezing. 04/12/15   [provider]  ALPRAZolam Duanne Moron) 1 MG tablet Take 1 mg by mouth at bedtime as needed for anxiety.    [provider]  ATROVENT HFA 17 MCG/ACT inhaler Inhale 2 puffs into the lungs every 6 (six) hours as needed (for wheezing/COPD). For COPD. 04/12/15   [provider]  cephALEXin (KEFLEX) 250 MG capsule Take 1 capsule (250 mg total) by mouth 4 (four) times daily. Patient not taking: Reported on 04/23/2017 09/04/16    Clabe Seal, PA-C  cetirizine (ZYRTEC) 10 MG tablet Take 10 mg by mouth at bedtime. 08/12/16   [provider]  cholecalciferol (VITAMIN D) 1000 units tablet Take 1,000 Units by mouth at bedtime.    [provider]  cyclobenzaprine (FLEXERIL) 5 MG tablet Take 1-2 tablets (5-10 mg total) 3 (three) times daily as needed by mouth for muscle spasms. 06/27/17   Duanne Guess, PA-C  meloxicam (MOBIC) 15 MG tablet Take 1 tablet (15 mg total) daily by mouth. 06/27/17   Duanne Guess, PA-C  montelukast (SINGULAIR) 10 MG tablet Take 10 mg by mouth at bedtime. 03/17/15   [provider]  polyethylene glycol powder (GLYCOLAX/MIRALAX) powder Take 17 g by mouth daily as needed for constipation. 06/26/16   [provider]    Allergies Linaclotide  Family History  Problem Relation Age of Onset  . Heart attack Mother   . Breast cancer Neg Hx     Social History Social History   Tobacco Use  . Smoking status: Former Smoker    Packs/day: 1.00    Years: 40.00    Pack years: 40.00    Types: Cigarettes  . Smokeless tobacco: Never Used  Substance Use Topics  . Alcohol use: No  . Drug use: No    Types: Marijuana    Comment: past    Review of Systems  Constitutional: No fever/chills Eyes: No visual changes. ENT: No sore throat.  Positive for sinus pressure and nasal drainage. Cardiovascular: Positive for chest pain. Respiratory: Positive for cough and shortness of breath. Gastrointestinal: No abdominal pain.  No nausea, no vomiting.  No diarrhea.  No constipation. Genitourinary: Negative for dysuria. Musculoskeletal: Negative for back pain. Skin: Negative for rash. Neurological: Negative for headaches, focal weakness or numbness.  ____________________________________________   PHYSICAL EXAM:  VITAL SIGNS: ED Triage Vitals [06/27/20 1346]  Enc Vitals Group     BP (!) 108/94     Pulse Rate 88     Resp 18     Temp 98.7 F (37.1 C)     Temp Source  Oral     SpO2 95 %     Weight 212 lb (96.2 kg)     Height 5\' 2"  (1.575 m)     Head Circumference      Peak Flow      Pain Score 0     Pain Loc      Pain Edu?      Excl. in [place]?     Constitutional: Alert and oriented. Eyes: Conjunctivae are normal. Head: Atraumatic. Nose: No congestion/rhinnorhea. Mouth/Throat: Mucous membranes are moist. Neck: Normal ROM Cardiovascular: Normal rate, regular rhythm. Grossly normal heart sounds. Respiratory: Normal respiratory effort.  No retractions. Lungs CTAB. Gastrointestinal: Soft and nontender. No distention. Genitourinary: deferred Musculoskeletal: No lower extremity tenderness nor edema. Neurologic:  Normal speech and language. No gross focal neurologic deficits are appreciated. Skin:  Skin is warm, dry and intact.  No rash noted. Psychiatric: Mood and affect are normal. Speech and behavior are normal.  ____________________________________________   LABS (all labs ordered are listed, but only abnormal results are displayed)  Labs Reviewed  BASIC METABOLIC PANEL - Abnormal; Notable for the following components:      Result Value   Glucose, Bld 118 (*)    All other components within normal limits  CBC - Abnormal; Notable for the following components:   Hemoglobin 15.7 (*)    HCT 46.9 (*)    All other components within normal limits  RESPIRATORY PANEL BY RT PCR (FLU A&B, COVID)  TROPONIN I (HIGH SENSITIVITY)  TROPONIN I (HIGH SENSITIVITY)   ____________________________________________  EKG  ED ECG REPORT I, Blake Divine, the attending physician, personally viewed and interpreted this ECG.   Date: 06/27/2020  EKG Time: 13:43  Rate: 70  Rhythm: normal sinus rhythm  Axis: Atrial paced rhythm  Intervals:none  ST&T Change: None   PROCEDURES  Procedure(s) performed (including Critical Care):  Procedures   ____________________________________________   INITIAL IMPRESSION / ASSESSMENT AND PLAN / ED COURSE        64 year old female with past medical history of hypertension, hyperlipidemia, COPD, GERD, and SI dysfunction status post pacemaker who presents to the ED complaining of 3 to 4 days of intermittent chest pain associated with a deep breath as well as a nonproductive cough.  She is not in any respiratory distress on my evaluation, maintaining O2 sats on room air.  Lungs clear bilaterally with no wheezing to suggest COPD exacerbation.  EKG shows paced rhythm with no ischemic changes, chest x-ray reviewed by me and shows no infiltrate, edema, or effusion.  Troponin is negative and I have low suspicion for primary cardiac process.  Given her pleuritic pain, we will further assess with CTA to rule out PE.  We will also test for COVID-19, but if work-up is negative I suspect her symptoms are due to a bronchitis with some amount of chronic sinusitis.  CTA is negative for PE or other acute process, she does have thyroid nodule that will need follow-up with ultrasound as well as small pulmonary nodule that will need follow-up in 1 year.  Patient was advised of these findings and she is appropriate for discharge home with ENT and PCP follow-up.  Patient agrees with plan.      ____________________________________________   FINAL CLINICAL IMPRESSION(S) / ED DIAGNOSES  Final diagnoses:  Shortness of breath  Chronic sinusitis, unspecified location  Thyroid nodule  Pulmonary nodule     ED Discharge Orders    None       Note:  This document was prepared using Dragon voice recognition software and may include unintentional dictation errors.   Blake Divine, MD 06/27/20 [phone]

## 2020-07-25 ENCOUNTER — Other Ambulatory Visit: Payer: Self-pay | Admitting: Family Medicine

## 2020-07-25 DIAGNOSIS — E041 Nontoxic single thyroid nodule: Secondary | ICD-10-CM

## 2020-07-31 ENCOUNTER — Other Ambulatory Visit: Payer: Self-pay

## 2020-07-31 ENCOUNTER — Ambulatory Visit
Admission: RE | Admit: 2020-07-31 | Discharge: 2020-07-31 | Disposition: A | Discharge status: Home / Self Care | Payer: Medicaid Other | Source: Ambulatory Visit | Attending: Family Medicine | Admitting: Family Medicine

## 2020-07-31 DIAGNOSIS — E041 Nontoxic single thyroid nodule: Secondary | ICD-10-CM | POA: Diagnosis present

## 2020-09-05 ENCOUNTER — Other Ambulatory Visit: Payer: Self-pay | Admitting: Family Medicine

## 2020-09-05 DIAGNOSIS — E041 Nontoxic single thyroid nodule: Secondary | ICD-10-CM

## 2020-09-10 ENCOUNTER — Other Ambulatory Visit: Payer: Self-pay | Admitting: Otolaryngology

## 2020-09-10 DIAGNOSIS — E041 Nontoxic single thyroid nodule: Secondary | ICD-10-CM

## 2020-09-12 ENCOUNTER — Ambulatory Visit: Payer: Medicaid Other

## 2020-09-14 ENCOUNTER — Ambulatory Visit
Admission: RE | Admit: 2020-09-14 | Discharge: 2020-09-14 | Disposition: A | Discharge status: Home / Self Care | Payer: Medicaid Other | Source: Ambulatory Visit | Attending: Otolaryngology | Admitting: Otolaryngology

## 2020-09-14 ENCOUNTER — Other Ambulatory Visit: Payer: Self-pay

## 2020-09-14 DIAGNOSIS — E041 Nontoxic single thyroid nodule: Secondary | ICD-10-CM | POA: Diagnosis present

## 2020-09-14 NOTE — Discharge Instructions (Signed)
Thyroid Needle Biopsy, Care After This sheet gives you information about how to care for yourself after your procedure. Your health care provider may also give you more specific instructions. If you have problems or questions, contact your health care provider. What can I expect after the procedure? After the procedure, it is common to have:  Soreness and tenderness that lasts for a few days.  Bruising where the needle was inserted (puncture site). Follow these instructions at home:  Take over-the-counter and prescription medicines only as told by your health care provider.  To help ease discomfort, keep your head raised (elevated) when you are lying down. When you move from lying down to sitting up, use both hands to support the back of your head and neck.  Check your puncture site every day for signs of infection. Check for: ? Redness, swelling, or pain. ? Fluid or blood. ? Warmth. ? Pus or a bad smell.  Return to your normal activities as told by your health care provider. Ask your health care provider what activities are safe for you.  Keep all follow-up visits as told by your health care provider. This is important.   Contact a health care provider if:  You have redness, swelling, or pain around your puncture site.  You have fluid or blood coming from your puncture site.  Your puncture site feels warm to the touch.  You have pus or a bad smell coming from your puncture site.  You have a fever. Get help right away if:  You have severe bleeding from the puncture site.  You have difficulty swallowing.  You have swollen glands (lymph nodes) in your neck. Summary  It is common to have some bruising and soreness where the needle was inserted in your lower front neck area (puncture site).  Check your puncture site every day for signs of infection, such as redness, swelling, or pain.  Get help right away if you have severe bleeding from your puncture site. This  information is not intended to replace advice given to you by your health care provider. Make sure you discuss any questions you have with your health care provider. Document Revised: 04/05/2020 Document Reviewed: 04/05/2020 Elsevier Patient Education  2021 [employer]  

## 2020-09-17 LAB — CYTOLOGY - NON PAP

## 2021-02-18 ENCOUNTER — Other Ambulatory Visit: Payer: Self-pay | Admitting: Family Medicine

## 2021-02-18 DIAGNOSIS — Z1231 Encounter for screening mammogram for malignant neoplasm of breast: Secondary | ICD-10-CM

## 2021-02-27 ENCOUNTER — Ambulatory Visit
Admission: RE | Admit: 2021-02-27 | Discharge: 2021-02-27 | Disposition: A | Discharge status: Home / Self Care | Payer: Medicaid Other | Source: Ambulatory Visit | Attending: Family Medicine | Admitting: Family Medicine

## 2021-02-27 ENCOUNTER — Other Ambulatory Visit: Payer: Self-pay

## 2021-02-27 DIAGNOSIS — Z1231 Encounter for screening mammogram for malignant neoplasm of breast: Secondary | ICD-10-CM | POA: Diagnosis present

## 2021-03-11 ENCOUNTER — Emergency Department: Payer: Medicaid Other

## 2021-03-11 ENCOUNTER — Emergency Department
Admission: EM | Admit: 2021-03-11 | Discharge: 2021-03-11 | Disposition: A | Discharge status: Home / Self Care | Payer: Medicaid Other | Attending: Emergency Medicine | Admitting: Emergency Medicine

## 2021-03-11 ENCOUNTER — Other Ambulatory Visit: Payer: Self-pay

## 2021-03-11 DIAGNOSIS — K5900 Constipation, unspecified: Secondary | ICD-10-CM | POA: Diagnosis not present

## 2021-03-11 DIAGNOSIS — R110 Nausea: Secondary | ICD-10-CM | POA: Insufficient documentation

## 2021-03-11 DIAGNOSIS — R1084 Generalized abdominal pain: Secondary | ICD-10-CM | POA: Diagnosis present

## 2021-03-11 DIAGNOSIS — J45909 Unspecified asthma, uncomplicated: Secondary | ICD-10-CM | POA: Insufficient documentation

## 2021-03-11 DIAGNOSIS — Z7951 Long term (current) use of inhaled steroids: Secondary | ICD-10-CM | POA: Insufficient documentation

## 2021-03-11 DIAGNOSIS — Z87891 Personal history of nicotine dependence: Secondary | ICD-10-CM | POA: Insufficient documentation

## 2021-03-11 LAB — LIPASE, BLOOD: Lipase: 47 U/L (ref 11–51)

## 2021-03-11 LAB — COMPREHENSIVE METABOLIC PANEL
ALT: 19 U/L (ref 0–44)
AST: 36 U/L (ref 15–41)
Albumin: 4 g/dL (ref 3.5–5.0)
Alkaline Phosphatase: 81 U/L (ref 38–126)
Anion gap: 8 (ref 5–15)
BUN: 15 mg/dL (ref 8–23)
CO2: 23 mmol/L (ref 22–32)
Calcium: 9 mg/dL (ref 8.9–10.3)
Chloride: 110 mmol/L (ref 98–111)
Creatinine, Ser: 0.71 mg/dL (ref 0.44–1.00)
GFR, Estimated: >60 mL/min (ref >60)
Glucose, Bld: 109 mg/dL — ABNORMAL HIGH (ref 70–99)
Potassium: 4 mmol/L (ref 3.5–5.1)
Sodium: 141 mmol/L (ref 135–145)
Total Bilirubin: 1 mg/dL (ref 0.3–1.2)
Total Protein: 7.1 g/dL (ref 6.5–8.1)

## 2021-03-11 LAB — URINALYSIS, COMPLETE (UACMP) WITH MICROSCOPIC
Bacteria, UA: NONE SEEN
Bilirubin Urine: NEGATIVE
Glucose, UA: NEGATIVE mg/dL
Hgb urine dipstick: NEGATIVE
Ketones, ur: NEGATIVE mg/dL
Nitrite: NEGATIVE
Protein, ur: NEGATIVE mg/dL
Specific Gravity, Urine: 1.013 (ref 1.005–1.030)
pH: 5 (ref 5.0–8.0)

## 2021-03-11 LAB — CBC
HCT: 42.8 % (ref 36.0–46.0)
Hemoglobin: 14.5 g/dL (ref 12.0–15.0)
MCH: 32.1 pg (ref 26.0–34.0)
MCHC: 33.9 g/dL (ref 30.0–36.0)
MCV: 94.7 fL (ref 80.0–100.0)
Platelets: 164 10*3/uL (ref 150–400)
RBC: 4.52 MIL/uL (ref 3.87–5.11)
RDW: 11.9 % (ref 11.5–15.5)
WBC: 6.3 10*3/uL (ref 4.0–10.5)
nRBC: 0 % (ref 0.0–0.2)

## 2021-03-11 MED ORDER — ONDANSETRON HCL 4 MG/2ML IJ SOLN
4.0000 mg | Freq: Once | INTRAMUSCULAR | Status: AC
  Administered 2021-03-11: 4 mg via INTRAVENOUS
  Filled 2021-03-11: qty 2

## 2021-03-11 MED ORDER — IOHEXOL 350 MG/ML SOLN
100.0000 mL | Freq: Once | INTRAVENOUS | Status: AC | PRN
  Administered 2021-03-11: 100 mL via INTRAVENOUS
  Filled 2021-03-11: qty 100

## 2021-03-11 MED ORDER — LACTULOSE 10 GM/15ML PO SOLN: 20.0000 g | Freq: Every day | ORAL | 0 refills | Status: AC | PRN

## 2021-03-11 MED ORDER — HYDROCORTISONE ACETATE 25 MG RE SUPP: 25.0000 mg | Freq: Two times a day (BID) | RECTAL | 0 refills | Status: AC

## 2021-03-11 NOTE — Discharge Instructions (Addendum)
Take MiraLAX 1 capful in the morning and 1 capful in the afternoon if you still have not had a bowel movement.  Do this for 2 days.  If you are still not having significant bowel movements you can take the dose of lactulose that I prescribed.  Given concern for hemorrhoids and also prescribe some Anusol that helps with the itching swelling and discomfort.  But you can also do sitz bath's after having bowel movements to help prevent them and keep the area clean and dry.  Return to the ER for fevers, worsening pain or any other concerns.  Your CT scan is as below to be followed up with your primary care doctor and discuss your constipation with your GI doctor   1. No acute abnormality in the abdomen/pelvis.  2. Moderate colonic stool burden with fecalization of distal small  bowel contents, suggesting slow transit. No bowel obstruction or  inflammation. Minimal diverticulosis. No diverticulitis.  3. Prominent periuterine and adnexal vascularity, left greater than  right, with dilatation of the left ovarian vein and reflux of  contrast, can be seen with pelvic congestion syndrome in the  appropriate clinical setting.  4. Post cholecystectomy with stable biliary dilatation.  5. Small fat containing umbilical hernia.

## 2021-03-11 NOTE — ED Triage Notes (Signed)
Pt presents to the ED with c/o lower abd pain and constipation that began 1 week ago and progressively worsened.

## 2021-03-11 NOTE — ED Provider Notes (Signed)
[facility] Emergency Department Provider Note  ____________________________________________   Event Date/Time   First MD Initiated Contact with Patient 03/11/21 1545     (approximate)  I have reviewed the triage vital signs and the nursing notes.   HISTORY  Chief Complaint Abdominal Pain and Constipation    HPI Angelica Zhang is a 65 y.o. female with history of depression, anxiety, asthma, SSS w/ PM who comes in with abdominal pain.  Patient reports diffuse abdominal pain for the past few days, moderate, intermittent, nothing makes it better or worse.  She reports that she has a history of constipation but states that she did have a bowel movement yesterday without any relief in her symptoms.  She states that she does have a history of 2 hernia repairs and is worried that she could have twisting of her bowels.  She reports some associated nausea with it as well.  States that she feels like she cannot eat secondary to the discomfort.  Reports having a colonoscopy 4 years ago that was grossly reassuring.  She reports having a GI follow-up next month but was told to come into the ER to be evaluated due to significant worsening of symptoms.  She also reports feeling like she might have some hemorrhoids with some itching at her rectum        Past Medical History:  Diagnosis Date   Acute bronchitis    Acute upper respiratory infections of unspecified site    Anxiety    Asthma    Backache, unspecified    Bradycardia    Chronic airway obstruction, not elsewhere classified    Depressive disorder, not elsewhere classified    Hypotension, unspecified    Osteoarthrosis, unspecified whether generalized or localized, other specified sites    Other and unspecified hyperlipidemia    Screening for diabetes mellitus    Sinusitis    Umbilical hernia without mention of obstruction or gangrene    Unspecified hypertrophic and atrophic condition of skin    Unspecified  local infection of skin and subcutaneous tissue    Urinary calculus, unspecified     Patient Active Problem List   Diagnosis Date Noted   Sick sinus syndrome ([place]) 09/03/2016   Bradycardia 09/20/2012   Dizziness 09/20/2012   Morbid obesity ([place]) 09/20/2012   Smoking history 09/20/2012    Past Surgical History:  Procedure Laterality Date   BACK SURGERY     BREAST BIOPSY Left 01/07/2017   Affirm Bx- neg   CARDIAC CATHETERIZATION  09/20/2009   [facility];[place]   CHOLECYSTECTOMY     COLONOSCOPY WITH PROPOFOL N/A 04/23/2017   Procedure: COLONOSCOPY WITH PROPOFOL;  Surgeon: Lollie Sails, MD;  Location: [facility] ENDOSCOPY;  Service: Endoscopy;  Laterality: N/A;   HERNIA REPAIR     x 2   PACEMAKER INSERTION N/A 09/03/2016   Procedure: INSERTION PACEMAKER;  Surgeon: Isaias Cowman, MD;  Location: [facility] ORS;  Service: Cardiovascular;  Laterality: N/A;   ROTATOR CUFF REPAIR      Prior to Admission medications   Medication Sig Start Date End Date Taking? Authorizing Provider  ADVAIR HFA 230-21 MCG/ACT inhaler Inhale 2 puffs into the lungs 2 (two) times daily. 04/12/15   [provider]  albuterol (PROVENTIL HFA;VENTOLIN HFA) 108 (90 BASE) MCG/ACT inhaler Inhale 2 puffs into the lungs every 4 (four) hours as needed for wheezing or shortness of breath.     [provider]  albuterol (PROVENTIL) (2.5 MG/3ML) 0.083% nebulizer solution Take 3 mLs by  nebulization every 4 (four) hours as needed. For shortness of breath and wheezing. 04/12/15   [provider]  ALPRAZolam Duanne Moron) 1 MG tablet Take 1 mg by mouth at bedtime as needed for anxiety.    [provider]  ATROVENT HFA 17 MCG/ACT inhaler Inhale 2 puffs into the lungs every 6 (six) hours as needed (for wheezing/COPD). For COPD. 04/12/15   [provider]  cephALEXin (KEFLEX) 250 MG capsule Take 1 capsule (250 mg total) by mouth 4 (four) times daily. Patient not taking: Reported on 04/23/2017 09/04/16   Clabe Seal, PA-C  cetirizine (ZYRTEC) 10 MG tablet Take 10 mg by mouth at bedtime. 08/12/16   [provider]  cholecalciferol (VITAMIN D) 1000 units tablet Take 1,000 Units by mouth at bedtime.    [provider]  cyclobenzaprine (FLEXERIL) 5 MG tablet Take 1-2 tablets (5-10 mg total) 3 (three) times daily as needed by mouth for muscle spasms. 06/27/17   Duanne Guess, PA-C  meloxicam (MOBIC) 15 MG tablet Take 1 tablet (15 mg total) daily by mouth. 06/27/17   Duanne Guess, PA-C  montelukast (SINGULAIR) 10 MG tablet Take 10 mg by mouth at bedtime. 03/17/15   [provider]  omeprazole (PRILOSEC) 20 MG capsule Take 1 capsule (20 mg total) by mouth daily. 06/27/20 06/27/21  Blake Divine, MD  polyethylene glycol powder (GLYCOLAX/MIRALAX) powder Take 17 g by mouth daily as needed for constipation. 06/26/16   [provider]    Allergies Linaclotide  Family History  Problem Relation Age of Onset   Heart attack Mother    Breast cancer Neg Hx     Social History Social History   Tobacco Use   Smoking status: Former    Packs/day: 1.00    Years: 40.00    Pack years: 40.00    Types: Cigarettes   Smokeless tobacco: Never  Substance Use Topics   Alcohol use: No   Drug use: No    Types: Marijuana    Comment: past      Review of Systems Constitutional: No fever/chills Eyes: No visual changes. ENT: No sore throat. Cardiovascular: Denies chest pain. Respiratory: Denies shortness of breath. Gastrointestinal: Abdominal pain, rectal pain Genitourinary: Negative for dysuria. Musculoskeletal: Negative for back pain. Skin: Negative for rash. Neurological: Negative for headaches, focal weakness or numbness. All other ROS negative ____________________________________________   PHYSICAL EXAM:  VITAL SIGNS: ED Triage Vitals  Enc Vitals Group     BP 03/11/21 1429 116/83     Pulse Rate 03/11/21 1429 68     Resp 03/11/21 1429 16     Temp 03/11/21  1429 98 F (36.7 C)     Temp Source 03/11/21 1429 Oral     SpO2 03/11/21 1429 95 %     Weight 03/11/21 1430 211 lb 10.3 oz (96 kg)     Height 03/11/21 1430 '5\' 2"'$  (1.575 m)     Head Circumference --      Peak Flow --      Pain Score 03/11/21 1430 5     Pain Loc --      Pain Edu? --      Excl. in [place]? --     Constitutional: Alert and oriented. Well appearing and in no acute distress. Eyes: Conjunctivae are normal. EOMI. Head: Atraumatic. Nose: No congestion/rhinnorhea. Mouth/Throat: Mucous membranes are moist.   Neck: No stridor. Trachea Midline. FROM Cardiovascular: Normal rate, regular rhythm. Grossly normal heart sounds.  Good peripheral circulation. Respiratory:  Normal respiratory effort.  No retractions. Lungs CTAB. Gastrointestinal: Tenderness throughout her abdomen.  No distention. No abdominal bruits.  Musculoskeletal: No lower extremity tenderness nor edema.  No joint effusions. Neurologic:  Normal speech and language. No gross focal neurologic deficits are appreciated.  Skin:  Skin is warm, dry and intact. No rash noted. Psychiatric: Mood and affect are normal. Speech and behavior are normal. GU: No stool ball noted.  No obvious external hemorrhoid  ____________________________________________   LABS (all labs ordered are listed, but only abnormal results are displayed)  Labs Reviewed  COMPREHENSIVE METABOLIC PANEL - Abnormal; Notable for the following components:      Result Value   Glucose, Bld 109 (*)    All other components within normal limits  URINALYSIS, COMPLETE (UACMP) WITH MICROSCOPIC - Abnormal; Notable for the following components:   Color, Urine YELLOW (*)    APPearance CLEAR (*)    Leukocytes,Ua TRACE (*)    All other components within normal limits  LIPASE, BLOOD  CBC   ____________________________________________  RADIOLOGY Robert Bellow, personally viewed and evaluated these images (plain radiographs) as part of my medical decision making,  as well as reviewing the written report by the radiologist.  ED MD interpretation: Stool noted  Official radiology report(s): DG Abdomen 1 View  Result Date: 03/11/2021 CLINICAL DATA:  Constipation in a 65 year old female, gaseous and bloated by report. EXAM: ABDOMEN - 1 VIEW COMPARISON:  CT abdomen and pelvis from 2017. FINDINGS: Dual lead pacer device terminates over the cardiac silhouette. Device is incompletely imaged. Lung bases are clear. Signs of cholecystectomy in the RIGHT upper quadrant. Stool and gas throughout the colon. No signs of obstruction. Gas and stool extending the level of the rectum. No abnormal calcifications or gross organomegaly. Signs of lumbar spinal fusion in lower lumbar spine. Spinal degenerative changes. On limited assessment no acute regional skeletal process. IMPRESSION: 1. Stool and gas throughout the colon and rectum. No signs of obstruction. 2. Signs of cholecystectomy and lumbar spinal fusion. Electronically Signed   By: Zetta Bills M.D.   On: 03/11/2021 15:28   CT ABDOMEN PELVIS W CONTRAST  Result Date: 03/11/2021 CLINICAL DATA:  Abdominal distension. Lower abdominal pain and constipation. Progressive pain for 1 week. EXAM: CT ABDOMEN AND PELVIS WITH CONTRAST TECHNIQUE: Multidetector CT imaging of the abdomen and pelvis was performed using the standard protocol following bolus administration of intravenous contrast. CONTRAST:  135m OMNIPAQUE IOHEXOL 350 MG/ML SOLN COMPARISON:  Radiograph earlier today.  CT 03/20/2016 FINDINGS: Lower chest: Clear lung bases without acute airspace disease. No pleural fluid. Pacemaker in the right ventricle. Hepatobiliary: Borderline hepatic steatosis. No focal hepatic lesion. Post cholecystectomy with stable biliary dilatation, common bile duct measures 12 mm, unchanged. Minimal central intrahepatic biliary ductal dilatation. No visualized choledocholithiasis. Pancreas: No ductal dilatation or inflammation. Spleen: Normal in size  without focal abnormality. Adrenals/Urinary Tract: Normal adrenal glands. No hydronephrosis or perinephric edema. Homogeneous renal enhancement with symmetric excretion on delayed phase imaging. Cyst in the posterior left kidney measures 2.8 cm, previously 2.3 cm, however appears simple. No evidence of renal calculi or solid lesion. Urinary bladder is near completely empty and not well assessed. Stomach/Bowel: Decompressed stomach. No small bowel obstruction or inflammation. Minimal fecalization of distal small bowel contents. Normal appendix. Moderate colonic stool burden. Occasional colonic diverticula without diverticulitis. Vascular/Lymphatic: Moderate aortic atherosclerosis. No aortic aneurysm. Circumaortic left renal vein. Patent portal vein. No bulky abdominopelvic adenopathy. Reproductive: There is prominent periuterine and adnexal vascularity, left greater  than right. Dilatation of the left ovarian vein with reflux of contrast, ovarian vein measures 9 mm. Prominent right ovarian vein at 7 mm. Quiescent uterus. No adnexal mass. Other: No free air, free fluid, or intra-abdominal fluid collection. Small fat containing umbilical hernia Musculoskeletal: L4-S1 posterior fusion. The left L4 pedicle screw approaches the superior cortex of L4 vertebral body. There are no acute or suspicious osseous abnormalities. IMPRESSION: 1. No acute abnormality in the abdomen/pelvis. 2. Moderate colonic stool burden with fecalization of distal small bowel contents, suggesting slow transit. No bowel obstruction or inflammation. Minimal diverticulosis. No diverticulitis. 3. Prominent periuterine and adnexal vascularity, left greater than right, with dilatation of the left ovarian vein and reflux of contrast, can be seen with pelvic congestion syndrome in the appropriate clinical setting. 4. Post cholecystectomy with stable biliary dilatation. 5. Small fat containing umbilical hernia. Aortic Atherosclerosis (ICD10-I70.0).  Electronically Signed   By: Keith Rake M.D.   On: 03/11/2021 17:16    ____________________________________________   PROCEDURES  Procedure(s) performed (including Critical Care):  Procedures   ____________________________________________   INITIAL IMPRESSION / ASSESSMENT AND PLAN / ED COURSE  ARLETTA TORELL was evaluated in Emergency Department on 03/11/2021 for the symptoms described in the history of present illness. She was evaluated in the context of the global COVID-19 pandemic, which necessitated consideration that the patient might be at risk for infection with the SARS-CoV-2 virus that causes COVID-19. Institutional protocols and algorithms that pertain to the evaluation of patients at risk for COVID-19 are in a state of rapid change based on information released by regulatory bodies including the CDC and federal and state organizations. These policies and algorithms were followed during the patient's care in the ED.    Patient comes in with concern for diffuse abdominal pain, nausea, concern for twisting bowels.  X-ray was reassuring but given patient's age and significant symptoms we will get CT scan to make sure evidence of obstruction, ileus or other acute pathology.  Labs were ordered to evaluate for Electra abnormalities, AKI.  We will give a dose of IV Zofran while waiting CT imaging  Labs are reassuring.  No white count to suggest an underlying infection.  Her LFTs and lipase are normal to suggest retained stone or pancreatitis.  No evidence of dehydration.  Her x-ray does show stool in the colon and rectum no signs of obstruction.  CT scan concerning for moderate colonic stool burden with concern for potential slow transit.  She is also concern for potentially some pelvic congestion syndrome but I suspect that her symptoms are more likely due to the stool.  I did provide her a copy report to discuss with her primary care doctor.  Patient reports having a lot of cramping and  pain when she took docusate in the past.  We discussed trying to try something more gentle like MiraLAX 1 capful in the morning and 1 capful in the afternoon.  However I did give her 2-day prescription of lactulose if that was not working.  Given concern for some internal hemorrhoids also prescribe patient some Anusol and recommended sitz bath's  I discussed the provisional nature of ED diagnosis, the treatment so far, the ongoing plan of care, follow up appointments and return precautions with the patient and any family or support people present. They expressed understanding and agreed with the plan, discharged home.             ____________________________________________   FINAL CLINICAL IMPRESSION(S) / ED DIAGNOSES  Final diagnoses:  Constipation, unspecified constipation type      MEDICATIONS GIVEN DURING THIS VISIT:  Medications  ondansetron (ZOFRAN) injection 4 mg (4 mg Intravenous Given 03/11/21 1625)  iohexol (OMNIPAQUE) 350 MG/ML injection 100 mL (100 mLs Intravenous Contrast Given 03/11/21 1646)     ED Discharge Orders          Ordered    lactulose (CHRONULAC) 10 GM/15ML solution  Daily PRN        03/11/21 1803    hydrocortisone (ANUSOL-HC) 25 MG suppository  Every 12 hours        03/11/21 1804             Note:  This document was prepared using Dragon voice recognition software and may include unintentional dictation errors.    Vanessa Catharine, MD 03/11/21 1806

## 2021-06-10 ENCOUNTER — Ambulatory Visit: Payer: Medicaid Other | Admitting: Anesthesiology

## 2021-06-10 ENCOUNTER — Encounter: Payer: Self-pay | Admitting: Anesthesiology

## 2021-06-10 ENCOUNTER — Encounter
Admission: RE | Disposition: A | Discharge status: Home / Self Care | Payer: Self-pay | Source: Ambulatory Visit | Attending: Gastroenterology

## 2021-06-10 ENCOUNTER — Ambulatory Visit
Admission: RE | Admit: 2021-06-10 | Discharge: 2021-06-10 | Disposition: A | Discharge status: Home / Self Care | Payer: Medicaid Other | Source: Ambulatory Visit | Attending: Gastroenterology | Admitting: Gastroenterology

## 2021-06-10 DIAGNOSIS — Z6839 Body mass index (BMI) 39.0-39.9, adult: Secondary | ICD-10-CM | POA: Diagnosis not present

## 2021-06-10 DIAGNOSIS — Z791 Long term (current) use of non-steroidal anti-inflammatories (NSAID): Secondary | ICD-10-CM | POA: Insufficient documentation

## 2021-06-10 DIAGNOSIS — E669 Obesity, unspecified: Secondary | ICD-10-CM | POA: Diagnosis not present

## 2021-06-10 DIAGNOSIS — R194 Change in bowel habit: Secondary | ICD-10-CM | POA: Diagnosis present

## 2021-06-10 DIAGNOSIS — Z87891 Personal history of nicotine dependence: Secondary | ICD-10-CM | POA: Insufficient documentation

## 2021-06-10 DIAGNOSIS — K5730 Diverticulosis of large intestine without perforation or abscess without bleeding: Secondary | ICD-10-CM | POA: Insufficient documentation

## 2021-06-10 DIAGNOSIS — K640 First degree hemorrhoids: Secondary | ICD-10-CM | POA: Diagnosis not present

## 2021-06-10 DIAGNOSIS — D123 Benign neoplasm of transverse colon: Secondary | ICD-10-CM | POA: Insufficient documentation

## 2021-06-10 DIAGNOSIS — K581 Irritable bowel syndrome with constipation: Secondary | ICD-10-CM | POA: Diagnosis not present

## 2021-06-10 DIAGNOSIS — Z7951 Long term (current) use of inhaled steroids: Secondary | ICD-10-CM | POA: Diagnosis not present

## 2021-06-10 DIAGNOSIS — Z888 Allergy status to other drugs, medicaments and biological substances status: Secondary | ICD-10-CM | POA: Insufficient documentation

## 2021-06-10 DIAGNOSIS — K219 Gastro-esophageal reflux disease without esophagitis: Secondary | ICD-10-CM | POA: Diagnosis not present

## 2021-06-10 HISTORY — PX: COLONOSCOPY WITH PROPOFOL: SHX5780

## 2021-06-10 SURGERY — COLONOSCOPY WITH PROPOFOL
Anesthesia: General

## 2021-06-10 MED ORDER — LIDOCAINE HCL (PF) 1 % IJ SOLN
INTRAMUSCULAR | Status: AC
  Filled 2021-06-10: qty 2

## 2021-06-10 MED ORDER — PROPOFOL 500 MG/50ML IV EMUL
INTRAVENOUS | Status: DC | PRN
  Administered 2021-06-10: 150 ug/kg/min via INTRAVENOUS

## 2021-06-10 MED ORDER — SODIUM CHLORIDE 0.9 % IV SOLN
INTRAVENOUS | Status: DC
  Administered 2021-06-10: 1000 mL via INTRAVENOUS

## 2021-06-10 MED ORDER — PROPOFOL 500 MG/50ML IV EMUL
INTRAVENOUS | Status: AC
  Filled 2021-06-10: qty 50

## 2021-06-10 NOTE — Interval H&P Note (Signed)
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  The various methods of treatment have been discussed with the patient and family. After consideration of risks, benefits and other options for treatment, the patient has consented to  Procedure(s): COLONOSCOPY WITH PROPOFOL (N/A) as a surgical intervention.  The patient's history has been reviewed, patient examined, no change in status, stable for surgery.  I have reviewed the patient's chart and labs.  Questions were answered to the patient's satisfaction.     Lesly Rubenstein  Ok to proceed with colonoscopy

## 2021-06-10 NOTE — Transfer of Care (Signed)
Immediate Anesthesia Transfer of Care Note  Patient: Angelica Zhang  Procedure(s) Performed: COLONOSCOPY WITH PROPOFOL  Patient Location: PACU  Anesthesia Type:General  Level of Consciousness: awake and sedated  Airway & Oxygen Therapy: Patient Spontanous Breathing and Patient connected to nasal cannula oxygen  Post-op Assessment: Report given to RN and Post -op Vital signs reviewed and stable  Post vital signs: Reviewed and stable  Last Vitals:  Vitals Value Taken Time  BP    Temp    Pulse    Resp    SpO2      Last Pain:  Vitals:   06/10/21 1133  TempSrc: Temporal  PainSc: 0-No pain         Complications: No notable events documented.

## 2021-06-10 NOTE — Anesthesia Preprocedure Evaluation (Signed)
Anesthesia Evaluation  Patient identified by MRN, date of birth, ID band Patient awake    Reviewed: Allergy & Precautions, NPO status , Patient's Chart, lab work & pertinent test results  History of Anesthesia Complications Negative for: history of anesthetic complications  Airway Mallampati: III  TM Distance: >3 FB Neck ROM: Full    Dental  (+) Poor Dentition   Pulmonary neg sleep apnea, COPD,  COPD inhaler, former smoker,    breath sounds clear to auscultation- rhonchi (-) wheezing      Cardiovascular Exercise Tolerance: Good (-) hypertension(-) CAD, (-) Past MI, (-) Cardiac Stents and (-) CABG + dysrhythmias  Rhythm:Regular Rate:Normal - Systolic murmurs and - Diastolic murmurs    Neuro/Psych neg Seizures PSYCHIATRIC DISORDERS Anxiety Depression negative neurological ROS     GI/Hepatic negative GI ROS, Neg liver ROS,   Endo/Other  negative endocrine ROSneg diabetes  Renal/GU negative Renal ROS     Musculoskeletal  (+) Arthritis ,   Abdominal (+) + obese,   Peds  Hematology negative hematology ROS (+)   Anesthesia Other Findings Past Medical History: No date: Acute bronchitis No date: Acute upper respiratory infections of unspecified site No date: Anxiety No date: Asthma No date: Backache, unspecified No date: Bradycardia No date: Chronic airway obstruction, not elsewhere classified No date: Depressive disorder, not elsewhere classified No date: Hypotension, unspecified No date: Osteoarthrosis, unspecified whether generalized or localized,  other specified sites No date: Other and unspecified hyperlipidemia No date: Screening for diabetes mellitus No date: Sinusitis No date: Umbilical hernia without mention of obstruction or gangrene No date: Unspecified hypertrophic and atrophic condition of skin No date: Unspecified local infection of skin and subcutaneous tissue No date: Urinary calculus,  unspecified   Reproductive/Obstetrics                             Anesthesia Physical Anesthesia Plan  ASA: 3  Anesthesia Plan: General   Post-op Pain Management:    Induction: Intravenous  PONV Risk Score and Plan: 2 and Propofol infusion  Airway Management Planned: Natural Airway  Additional Equipment:   Intra-op Plan:   Post-operative Plan:   Informed Consent: I have reviewed the patients History and Physical, chart, labs and discussed the procedure including the risks, benefits and alternatives for the proposed anesthesia with the patient or authorized representative who has indicated his/her understanding and acceptance.     Dental advisory given  Plan Discussed with: CRNA and Anesthesiologist  Anesthesia Plan Comments:         Anesthesia Quick Evaluation

## 2021-06-10 NOTE — Anesthesia Postprocedure Evaluation (Signed)
Anesthesia Post Note  Patient: Angelica Zhang  Procedure(s) Performed: COLONOSCOPY WITH PROPOFOL  Patient location during evaluation: Endoscopy Anesthesia Type: General Level of consciousness: awake and alert and oriented Pain management: pain level controlled Vital Signs Assessment: post-procedure vital signs reviewed and stable Respiratory status: spontaneous breathing, nonlabored ventilation and respiratory function stable Cardiovascular status: blood pressure returned to baseline and stable Postop Assessment: no signs of nausea or vomiting Anesthetic complications: no   No notable events documented.   Last Vitals:  Vitals:   06/10/21 1304 06/10/21 1314  BP: 112/70 130/65  Pulse: 62 (!) 59  Resp: 17 12  Temp:    SpO2: 100% 99%    Last Pain:  Vitals:   06/10/21 1314  TempSrc:   PainSc: 0-No pain                 Katianne Barre

## 2021-06-10 NOTE — Anesthesia Procedure Notes (Signed)
Date/Time: 06/10/2021 12:32 PM Performed by: Vaughan Sine Pre-anesthesia Checklist: Patient identified, Emergency Drugs available, Suction available, Patient being monitored and Timeout performed Patient Re-evaluated:Patient Re-evaluated prior to induction Oxygen Delivery Method: Simple face mask Preoxygenation: Pre-oxygenation with 100% oxygen Induction Type: IV induction Ventilation: Oral airway inserted - appropriate to patient size Placement Confirmation: positive ETCO2 and CO2 detector

## 2021-06-10 NOTE — H&P (Signed)
Outpatient short stay form Pre-procedure 06/10/2021  Angelica Rubenstein, MD  Primary Physician: Donnie Coffin, MD  Reason for visit:  Constipation  History of present illness:   65 y/o lady with history of IBS-C, obesity, and GERD here for colonoscopy due to history of abdominal pain and worsening constipation. Had colonoscopy 4 years ago with small TA. History of umbilical hernia repairs. No blood thinners. No family history of GI malignancies.    Current Facility-Administered Medications:    0.9 %  sodium chloride infusion, , Intravenous, Continuous, Keygan Dumond, Hilton Cork, MD, Last Rate: 20 mL/hr at 06/10/21 1157, 1,000 mL at 06/10/21 1157   lidocaine (PF) (XYLOCAINE) 1 % injection, , , ,   Medications Prior to Admission  Medication Sig Dispense Refill Last Dose   albuterol (PROVENTIL HFA;VENTOLIN HFA) 108 (90 BASE) MCG/ACT inhaler Inhale 2 puffs into the lungs every 4 (four) hours as needed for wheezing or shortness of breath.    Past Month   ATROVENT HFA 17 MCG/ACT inhaler Inhale 2 puffs into the lungs every 6 (six) hours as needed (for wheezing/COPD). For COPD.  3 06/10/2021 at n   ADVAIR HFA 230-21 MCG/ACT inhaler Inhale 2 puffs into the lungs 2 (two) times daily.  6    albuterol (PROVENTIL) (2.5 MG/3ML) 0.083% nebulizer solution Take 3 mLs by nebulization every 4 (four) hours as needed. For shortness of breath and wheezing.  11    ALPRAZolam (XANAX) 1 MG tablet Take 1 mg by mouth at bedtime as needed for anxiety.      cephALEXin (KEFLEX) 250 MG capsule Take 1 capsule (250 mg total) by mouth 4 (four) times daily. (Patient not taking: Reported on 04/23/2017) 28 capsule 7    cetirizine (ZYRTEC) 10 MG tablet Take 10 mg by mouth at bedtime.  7    cholecalciferol (VITAMIN D) 1000 units tablet Take 1,000 Units by mouth at bedtime.      cyclobenzaprine (FLEXERIL) 5 MG tablet Take 1-2 tablets (5-10 mg total) 3 (three) times daily as needed by mouth for muscle spasms. 20 tablet 0    meloxicam  (MOBIC) 15 MG tablet Take 1 tablet (15 mg total) daily by mouth. 30 tablet 0    montelukast (SINGULAIR) 10 MG tablet Take 10 mg by mouth at bedtime.  5    omeprazole (PRILOSEC) 20 MG capsule Take 1 capsule (20 mg total) by mouth daily. 30 capsule 1    polyethylene glycol powder (GLYCOLAX/MIRALAX) powder Take 17 g by mouth daily as needed for constipation.  0      Allergies  Allergen Reactions   Linaclotide     Other reaction(s): Other (See Comments) Flushing     Past Medical History:  Diagnosis Date   Acute bronchitis    Acute upper respiratory infections of unspecified site    Anxiety    Asthma    Backache, unspecified    Bradycardia    Chronic airway obstruction, not elsewhere classified    Depressive disorder, not elsewhere classified    Hypotension, unspecified    Osteoarthrosis, unspecified whether generalized or localized, other specified sites    Other and unspecified hyperlipidemia    Screening for diabetes mellitus    Sinusitis    Umbilical hernia without mention of obstruction or gangrene    Unspecified hypertrophic and atrophic condition of skin    Unspecified local infection of skin and subcutaneous tissue    Urinary calculus, unspecified     Review of systems:  Otherwise negative.  Physical Exam  Gen: Alert, oriented. Appears stated age.  HEENT: PERRLA. Lungs: No respiratory distress CV: RRR Abd: soft, benign, no masses Ext: No edema    Planned procedures: Proceed with colonoscopy. The patient understands the nature of the planned procedure, indications, risks, alternatives and potential complications including but not limited to bleeding, infection, perforation, damage to internal organs and possible oversedation/side effects from anesthesia. The patient agrees and gives consent to proceed.  Please refer to procedure notes for findings, recommendations and patient disposition/instructions.     Angelica Rubenstein, MD [facility]  Gastroenterology

## 2021-06-10 NOTE — Op Note (Signed)
[facility] Gastroenterology Patient Name: Angelica Zhang Procedure Date: 06/10/2021 12:22 PM MRN: [id number] Account #: [id number] Date of Birth: 1956-03-23 Admit Type: Outpatient Age: 65 Room: [facility] ENDO ROOM 3 Gender: Female Note Status: Finalized Instrument Name: Park Meo [id number] Procedure:             Colonoscopy Indications:           Generalized abdominal pain, Change in bowel habits Providers:             Andrey Farmer MD, MD Referring MD:          Bridget Hartshorn [facility], dr (Referring MD) Medicines:             Monitored Anesthesia Care Complications:         No immediate complications. Estimated blood loss:                         Minimal. Procedure:             Pre-Anesthesia Assessment:                        - Prior to the procedure, a History and Physical was                         performed, and patient medications and allergies were                         reviewed. The patient is competent. The risks and                         benefits of the procedure and the sedation options and                         risks were discussed with the patient. All questions                         were answered and informed consent was obtained.                         Patient identification and proposed procedure were                         verified by the physician, the nurse, the anesthetist                         and the technician in the endoscopy suite. Mental                         Status Examination: alert and oriented. Airway                         Examination: normal oropharyngeal airway and neck                         mobility. Respiratory Examination: clear to                         auscultation. CV Examination: normal. Prophylactic  Antibiotics: The patient does not require prophylactic                         antibiotics. Prior Anticoagulants: The patient has                         taken no previous  anticoagulant or antiplatelet agents                         except for aspirin. ASA Grade Assessment: III - A                         patient with severe systemic disease. After reviewing                         the risks and benefits, the patient was deemed in                         satisfactory condition to undergo the procedure. The                         anesthesia plan was to use monitored anesthesia care                         (MAC). Immediately prior to administration of                         medications, the patient was re-assessed for adequacy                         to receive sedatives. The heart rate, respiratory                         rate, oxygen saturations, blood pressure, adequacy of                         pulmonary ventilation, and response to care were                         monitored throughout the procedure. The physical                         status of the patient was re-assessed after the                         procedure.                        After obtaining informed consent, the colonoscope was                         passed under direct vision. Throughout the procedure,                         the patient's blood pressure, pulse, and oxygen                         saturations were monitored continuously. The  Colonoscope was introduced through the anus and                         advanced to the the cecum, identified by appendiceal                         orifice and ileocecal valve. The colonoscopy was                         somewhat difficult due to a redundant colon.                         Successful completion of the procedure was aided by                         applying abdominal pressure. The patient tolerated the                         procedure well. The quality of the bowel preparation                         was excellent. Findings:      The perianal and digital rectal examinations were normal.      A 1 mm polyp  was found in the hepatic flexure. The polyp was sessile.       The polyp was removed with a jumbo cold forceps. Resection and retrieval       were complete. Estimated blood loss was minimal.      A single small-mouthed diverticulum was found in the hepatic flexure.      A few small-mouthed diverticula were found in the sigmoid colon.      Internal hemorrhoids were found during retroflexion. The hemorrhoids       were Grade I (internal hemorrhoids that do not prolapse).      The exam was otherwise without abnormality on direct and retroflexion       views. Impression:            - One 1 mm polyp at the hepatic flexure, removed with                         a jumbo cold forceps. Resected and retrieved.                        - Diverticulosis at the hepatic flexure.                        - Diverticulosis in the sigmoid colon.                        - Internal hemorrhoids.                        - The examination was otherwise normal on direct and                         retroflexion views. Recommendation:        - Discharge patient to home.                        -  Resume previous diet.                        - Continue present medications.                        - Await pathology results.                        - Repeat colonoscopy in 7 years for surveillance.                        - Return to referring physician as previously                         scheduled. Procedure Code(s):     --- Professional ---                        [phone], Colonoscopy, flexible; with biopsy, single or                         multiple Diagnosis Code(s):     --- Professional ---                        K63.5, Polyp of colon                        K64.0, First degree hemorrhoids                        R10.84, Generalized abdominal pain                        R19.4, Change in bowel habit                        K57.30, Diverticulosis of large intestine without                         perforation or abscess without  bleeding CPT copyright 2019 American [employer]. All rights reserved. The codes documented in this report are preliminary and upon coder review may  be revised to meet current compliance requirements. Andrey Farmer MD, MD 06/10/2021 12:53:25 PM Number of Addenda: 0 Note Initiated On: 06/10/2021 12:22 PM Scope Withdrawal Time: 0 hours 9 minutes 5 seconds  Total Procedure Duration: 0 hours 16 minutes 8 seconds  Estimated Blood Loss:  Estimated blood loss was minimal.      [facility]

## 2021-06-11 ENCOUNTER — Encounter: Payer: Self-pay | Admitting: Gastroenterology

## 2021-06-11 LAB — SURGICAL PATHOLOGY

## 2021-07-22 ENCOUNTER — Other Ambulatory Visit: Payer: Self-pay | Admitting: Family Medicine

## 2021-07-22 DIAGNOSIS — R911 Solitary pulmonary nodule: Secondary | ICD-10-CM

## 2021-07-23 ENCOUNTER — Ambulatory Visit: Payer: Medicaid Other | Admitting: Podiatry

## 2021-07-23 ENCOUNTER — Ambulatory Visit (INDEPENDENT_AMBULATORY_CARE_PROVIDER_SITE_OTHER): Payer: Medicaid Other

## 2021-07-23 ENCOUNTER — Other Ambulatory Visit: Payer: Self-pay

## 2021-07-23 DIAGNOSIS — M67471 Ganglion, right ankle and foot: Secondary | ICD-10-CM

## 2021-07-23 NOTE — Progress Notes (Signed)
HPI: 65 y.o. female presenting today as a new patient for evaluation of a symptomatic lesion to the dorsal aspect of the right foot.  Patient states that she noticed a bump develop on the top of her foot almost spontaneously over the past 6 months.  She denies a history of injury.  She presents for further treatment evaluation  Past Medical History:  Diagnosis Date   Acute bronchitis    Acute upper respiratory infections of unspecified site    Anxiety    Asthma    Backache, unspecified    Bradycardia    Chronic airway obstruction, not elsewhere classified    Depressive disorder, not elsewhere classified    Hypotension, unspecified    Osteoarthrosis, unspecified whether generalized or localized, other specified sites    Other and unspecified hyperlipidemia    Screening for diabetes mellitus    Sinusitis    Umbilical hernia without mention of obstruction or gangrene    Unspecified hypertrophic and atrophic condition of skin    Unspecified local infection of skin and subcutaneous tissue    Urinary calculus, unspecified     Past Surgical History:  Procedure Laterality Date   BACK SURGERY     BREAST BIOPSY Left 01/07/2017   Affirm Bx- neg   CARDIAC CATHETERIZATION  09/20/2009   [facility];[place]   CHOLECYSTECTOMY     COLONOSCOPY WITH PROPOFOL N/A 04/23/2017   Procedure: COLONOSCOPY WITH PROPOFOL;  Surgeon: Lollie Sails, MD;  Location: [facility] ENDOSCOPY;  Service: Endoscopy;  Laterality: N/A;   COLONOSCOPY WITH PROPOFOL N/A 06/10/2021   Procedure: COLONOSCOPY WITH PROPOFOL;  Surgeon: Lesly Rubenstein, MD;  Location: [facility] ENDOSCOPY;  Service: Endoscopy;  Laterality: N/A;   HERNIA REPAIR     x 2   PACEMAKER INSERTION N/A 09/03/2016   Procedure: INSERTION PACEMAKER;  Surgeon: Isaias Cowman, MD;  Location: [facility] ORS;  Service: Cardiovascular;  Laterality: N/A;   ROTATOR CUFF REPAIR       Physical Exam: General: The patient is alert and oriented x3 in no acute  distress.  Dermatology: Skin is warm, dry and supple bilateral lower extremities. Negative for open lesions or macerations.  Vascular: Palpable pedal pulses bilaterally. No edema or erythema noted. Capillary refill within normal limits.  Neurological: Epicritic and protective threshold grossly intact bilaterally.   Musculoskeletal Exam: Range of motion within normal limits to all pedal and ankle joints bilateral. Muscle strength 5/5 in all groups bilateral.  There is some sensitivity and pain on palpation to the similar fluctuant nodule to the dorsal aspect of the foot  Radiographic Exam:  Normal osseous mineralization. Joint spaces preserved. No fracture/dislocation/boney destruction.  On lateral view there is a localized cyst to the dorsal aspect of the foot approximately 2.0 cm in diameter.  This directly correlates clinically with the area of the ganglion cyst  Assessment: 1.  Ganglion cyst right foot   Plan of Care:  1. Patient evaluated. X-Rays reviewed.  2.  Discussed the etiology regarding a ganglion cyst of the foot.  Initially pressure was applied to the area to see if the cyst would rupture and resorb and of the soft tissues of the foot.  Unfortunately I was unable to apply pressure to have the cyst rupture.  Decision was made to aspirate the ganglion cyst at this time.  I discussed this with the patient and she agreed. 3.  The area was prepped aseptically and 1 mL of 2% lidocaine plain was infiltrated in the area followed by aspiration of the  ganglion cyst with an 18-gauge needle.  Was approximately 1 mL of gelatinous mucoid material expressed and aspirated from the area 4.  0.5 cc Celestone Soluspan also been infiltrated into the area 5.  Light dressing applied.  Post care instructions provided. 6.  Return to clinic as needed      Edrick Kins, DPM [facility]  Dr. Edrick Kins, DPM    [address], [place] [address]                Office [phone]  Fax [phone]

## 2021-08-15 ENCOUNTER — Ambulatory Visit: Payer: Medicaid Other

## 2021-08-29 ENCOUNTER — Ambulatory Visit: Payer: Medicaid Other

## 2022-01-03 ENCOUNTER — Other Ambulatory Visit: Payer: Self-pay | Admitting: Otolaryngology

## 2022-01-03 DIAGNOSIS — E041 Nontoxic single thyroid nodule: Secondary | ICD-10-CM

## 2022-01-15 ENCOUNTER — Other Ambulatory Visit: Payer: Medicaid Other

## 2022-01-16 ENCOUNTER — Ambulatory Visit
Admission: RE | Admit: 2022-01-16 | Discharge: 2022-01-16 | Disposition: A | Discharge status: Home / Self Care | Payer: Medicare Other | Source: Ambulatory Visit | Attending: Otolaryngology | Admitting: Otolaryngology

## 2022-01-16 DIAGNOSIS — E041 Nontoxic single thyroid nodule: Secondary | ICD-10-CM

## 2022-01-27 ENCOUNTER — Other Ambulatory Visit: Payer: Self-pay | Admitting: Otolaryngology

## 2022-01-27 DIAGNOSIS — E041 Nontoxic single thyroid nodule: Secondary | ICD-10-CM

## 2022-03-11 ENCOUNTER — Ambulatory Visit: Payer: Medicare Other | Admitting: Physical Therapy

## 2022-03-17 ENCOUNTER — Emergency Department
Admission: EM | Admit: 2022-03-17 | Discharge: 2022-03-18 | Disposition: A | Discharge status: Home / Self Care | Payer: Medicare Other | Attending: Emergency Medicine | Admitting: Emergency Medicine

## 2022-03-17 ENCOUNTER — Other Ambulatory Visit: Payer: Self-pay

## 2022-03-17 ENCOUNTER — Emergency Department: Payer: Medicare Other

## 2022-03-17 DIAGNOSIS — R079 Chest pain, unspecified: Secondary | ICD-10-CM | POA: Diagnosis present

## 2022-03-17 DIAGNOSIS — Z8616 Personal history of COVID-19: Secondary | ICD-10-CM | POA: Diagnosis not present

## 2022-03-17 DIAGNOSIS — R059 Cough, unspecified: Secondary | ICD-10-CM | POA: Diagnosis not present

## 2022-03-17 DIAGNOSIS — R0602 Shortness of breath: Secondary | ICD-10-CM | POA: Diagnosis not present

## 2022-03-17 DIAGNOSIS — J449 Chronic obstructive pulmonary disease, unspecified: Secondary | ICD-10-CM | POA: Diagnosis not present

## 2022-03-17 DIAGNOSIS — F17200 Nicotine dependence, unspecified, uncomplicated: Secondary | ICD-10-CM | POA: Insufficient documentation

## 2022-03-17 DIAGNOSIS — J45909 Unspecified asthma, uncomplicated: Secondary | ICD-10-CM | POA: Insufficient documentation

## 2022-03-17 DIAGNOSIS — Z950 Presence of cardiac pacemaker: Secondary | ICD-10-CM | POA: Diagnosis not present

## 2022-03-17 LAB — TROPONIN I (HIGH SENSITIVITY)
Troponin I (High Sensitivity): 3 ng/L (ref <18)
Troponin I (High Sensitivity): 4 ng/L (ref <18)

## 2022-03-17 LAB — CBC
HCT: 43.8 % (ref 36.0–46.0)
Hemoglobin: 14.2 g/dL (ref 12.0–15.0)
MCH: 30.5 pg (ref 26.0–34.0)
MCHC: 32.4 g/dL (ref 30.0–36.0)
MCV: 94 fL (ref 80.0–100.0)
Platelets: 184 10*3/uL (ref 150–400)
RBC: 4.66 MIL/uL (ref 3.87–5.11)
RDW: 11.9 % (ref 11.5–15.5)
WBC: 7.3 10*3/uL (ref 4.0–10.5)
nRBC: 0 % (ref 0.0–0.2)

## 2022-03-17 LAB — BASIC METABOLIC PANEL
Anion gap: 7 (ref 5–15)
BUN: 16 mg/dL (ref 8–23)
CO2: 24 mmol/L (ref 22–32)
Calcium: 8.7 mg/dL — ABNORMAL LOW (ref 8.9–10.3)
Chloride: 112 mmol/L — ABNORMAL HIGH (ref 98–111)
Creatinine, Ser: 0.74 mg/dL (ref 0.44–1.00)
GFR, Estimated: >60 mL/min (ref >60)
Glucose, Bld: 97 mg/dL (ref 70–99)
Potassium: 3.8 mmol/L (ref 3.5–5.1)
Sodium: 143 mmol/L (ref 135–145)

## 2022-03-17 MED ORDER — HYDROCOD POLI-CHLORPHE POLI ER 10-8 MG/5ML PO SUER
5.0000 mL | Freq: Once | ORAL | Status: AC
  Administered 2022-03-17: 5 mL via ORAL
  Filled 2022-03-17: qty 5

## 2022-03-17 MED ORDER — METHYLPREDNISOLONE SODIUM SUCC 125 MG IJ SOLR
125.0000 mg | Freq: Once | INTRAMUSCULAR | Status: AC
  Administered 2022-03-17: 125 mg via INTRAVENOUS
  Filled 2022-03-17: qty 2

## 2022-03-17 MED ORDER — SODIUM CHLORIDE 0.9 % IV BOLUS
500.0000 mL | Freq: Once | INTRAVENOUS | Status: AC
  Administered 2022-03-17: 500 mL via INTRAVENOUS

## 2022-03-17 MED ORDER — IPRATROPIUM-ALBUTEROL 0.5-2.5 (3) MG/3ML IN SOLN
3.0000 mL | Freq: Once | RESPIRATORY_TRACT | Status: AC
  Administered 2022-03-17: 3 mL via RESPIRATORY_TRACT
  Filled 2022-03-17: qty 3

## 2022-03-17 NOTE — ED Triage Notes (Signed)
Pt to ED via [facility] from home. Pt recently dx with COVID 7/31 and states increased WOB and SOB. Pt with COPD and pacemaker in place. Pt reports at home oxygen reading mid 80s. Pt also endorses CP that radiates to her back.

## 2022-03-17 NOTE — ED Provider Triage Note (Signed)
  Emergency Medicine Provider Triage Evaluation Note  Angelica Zhang , a 66 y.o.female,  was evaluated in triage.  Pt complains of shortness of breath x 1 week.  She is additionally saying that she has chest pain that radiates to her back.  Her home oxygen reading was in the mid 80s today.  Was recently diagnosed with COVID on 03/10/2022   Review of Systems  Positive: Chest pain, shortness of breath Negative: Denies fever, chest pain, vomiting  Physical Exam   Vitals:   03/17/22 1814 03/17/22 1821  BP: 134/69   Pulse: 88   Resp: 18   Temp:  98 F (36.7 C)  SpO2: 96%    Gen:   Awake, no distress   Resp:  Normal effort  MSK:   Moves extremities without difficulty  Other:    Medical Decision Making  Given the patient's initial medical screening exam, the following diagnostic evaluation has been ordered. The patient will be placed in the appropriate treatment space, once one is available, to complete the evaluation and treatment. I have discussed the plan of care with the patient and I have advised the patient that an ED physician or mid-level practitioner will reevaluate their condition after the test results have been received, as the results may give them additional insight into the type of treatment they may need.    Diagnostics: Labs, CXR, EKG  Treatments: none immediately   Teodoro Spray, [place] 03/17/22 1901

## 2022-03-17 NOTE — ED Provider Notes (Signed)
[facility] Provider Note    Event Date/Time   First MD Initiated Contact with Patient 03/17/22 2326     (approximate)   History   Shortness of Breath and Chest Pain   HPI  Angelica Zhang is a 66 y.o. female brought to the ED via EMS from home with a chief complaint of shortness of breath.  Patient has a history of COPD.  States her home oxygen saturations were reading in the mid 80%'s.  Complains of cough and chest pain which radiates to her back.  Patient had a positive home COVID test on 03/07/2022.  She was seen by her PCP on 03/10/2022 and started on molnupiravir which she has finished.  She was not placed on a course of steroids.  Has not yet picked up her Tussionex or Tessalon Perles.  Had fevers last week but none this week.  Denies abdominal pain, nausea, vomiting or dizziness.  Patient is not vaccinated against COVID-19.     Past Medical History   Past Medical History:  Diagnosis Date  . Acute bronchitis   . Acute upper respiratory infections of unspecified site   . Anxiety   . Asthma   . Backache, unspecified   . Bradycardia   . Chronic airway obstruction, not elsewhere classified   . Depressive disorder, not elsewhere classified   . Hypotension, unspecified   . Osteoarthrosis, unspecified whether generalized or localized, other specified sites   . Other and unspecified hyperlipidemia   . Screening for diabetes mellitus   . Sinusitis   . Umbilical hernia without mention of obstruction or gangrene   . Unspecified hypertrophic and atrophic condition of skin   . Unspecified local infection of skin and subcutaneous tissue   . Urinary calculus, unspecified      Active Problem List   Patient Active Problem List   Diagnosis Date Noted  . Sick sinus syndrome ([place]) 09/03/2016  . Bradycardia 09/20/2012  . Dizziness 09/20/2012  . Morbid obesity ([place]) 09/20/2012  . Smoking history 09/20/2012     Past Surgical History   Past Surgical  History:  Procedure Laterality Date  . BACK SURGERY    . BREAST BIOPSY Left 01/07/2017   Affirm Bx- neg  . CARDIAC CATHETERIZATION  09/20/2009   [facility]  . CHOLECYSTECTOMY    . COLONOSCOPY WITH PROPOFOL N/A 04/23/2017   Procedure: COLONOSCOPY WITH PROPOFOL;  Surgeon: Lollie Sails, MD;  Location[facility] ENDOSCOPY;  Service: Endoscopy;  Laterality: N/A;  . COLONOSCOPY WITH PROPOFOL N/A 06/10/2021   Procedure: COLONOSCOPY WITH PROPOFOL;  Surgeon: Lesly Rubenstein, MD;  Location: [facility] ENDOSCOPY;  Service: Endoscopy;  Laterality: N/A;  . HERNIA REPAIR     x 2  . PACEMAKER INSERTION N/A 09/03/2016   Procedure: INSERTION PACEMAKER;  Surgeon: Isaias Cowman, MD;  Location: [facility] ORS;  Service: Cardiovascular;  Laterality: N/A;  . ROTATOR CUFF REPAIR       Home Medications   Prior to Admission medications   Medication Sig Start Date End Date Taking? Authorizing Provider  ADVAIR HFA 230-21 MCG/ACT inhaler Inhale 2 puffs into the lungs 2 (two) times daily. 04/12/15   [provider]  albuterol (PROVENTIL HFA;VENTOLIN HFA) 108 (90 BASE) MCG/ACT inhaler Inhale 2 puffs into the lungs every 4 (four) hours as needed for wheezing or shortness of breath.     [provider]  albuterol (PROVENTIL) (2.5 MG/3ML) 0.083% nebulizer solution Take 3 mLs by nebulization every 4 (four) hours as needed. For shortness  of breath and wheezing. 04/12/15   [provider]  ALPRAZolam Duanne Moron) 1 MG tablet Take 1 mg by mouth at bedtime as needed for anxiety.    [provider]  ATROVENT HFA 17 MCG/ACT inhaler Inhale 2 puffs into the lungs every 6 (six) hours as needed (for wheezing/COPD). For COPD. 04/12/15   [provider]  cephALEXin (KEFLEX) 250 MG capsule Take 1 capsule (250 mg total) by mouth 4 (four) times daily. Patient not taking: Reported on 04/23/2017 09/04/16   Clabe Seal, PA-C  cetirizine (ZYRTEC) 10 MG tablet Take 10 mg by mouth at bedtime. 08/12/16   [provider]  cholecalciferol (VITAMIN D) 1000 units tablet Take 1,000 Units by mouth at bedtime.    [provider]  cyclobenzaprine (FLEXERIL) 5 MG tablet Take 1-2 tablets (5-10 mg total) 3 (three) times daily as needed by mouth for muscle spasms. 06/27/17   Duanne Guess, PA-C  meloxicam (MOBIC) 15 MG tablet Take 1 tablet (15 mg total) daily by mouth. 06/27/17   Duanne Guess, PA-C  montelukast (SINGULAIR) 10 MG tablet Take 10 mg by mouth at bedtime. 03/17/15   [provider]  omeprazole (PRILOSEC) 20 MG capsule Take 1 capsule (20 mg total) by mouth daily. 06/27/20 06/27/21  Blake Divine, MD  polyethylene glycol powder (GLYCOLAX/MIRALAX) powder Take 17 g by mouth daily as needed for constipation. 06/26/16   [provider]     Allergies  Linaclotide   Family History   Family History  Problem Relation Age of Onset  . Heart attack Mother   . Breast cancer Neg Hx      Physical Exam  Triage Vital Signs: ED Triage Vitals  Enc Vitals Group     BP 03/17/22 1814 134/69     Pulse Rate 03/17/22 1814 88     Resp 03/17/22 1814 18     Temp 03/17/22 1821 98 F (36.7 C)     Temp Source 03/17/22 1814 Oral     SpO2 03/17/22 1814 96 %     Weight 03/17/22 1815 220 lb (99.8 kg)     Height 03/17/22 1815 '5\' 2"'$  (1.575 m)     Head Circumference --      Peak Flow --      Pain Score 03/17/22 1814 5     Pain Loc --      Pain Edu? --      Excl. in [place]? --     Updated Vital Signs: BP (!) 132/101   Pulse 86   Temp 97.7 F (36.5 C) (Oral)   Resp 18   Ht '5\' 2"'$  (1.575 m)   Wt 99.8 kg   SpO2 97%   BMI 40.24 kg/m    General: Awake, mild distress.  CV:  RRR.  Good peripheral perfusion.  Resp:  Normal effort.  CTAB. Abd:  Nontender.  No distention.  Other:  Bilateral calves are nontender and nonswollen.   ED Results / Procedures / Treatments  Labs (all labs ordered are listed, but only abnormal results are displayed) Labs Reviewed  BASIC METABOLIC  PANEL - Abnormal; Notable for the following components:      Result Value   Chloride 112 (*)    Calcium 8.7 (*)    All other components within normal limits  CBC  TROPONIN I (HIGH SENSITIVITY)  TROPONIN I (HIGH SENSITIVITY)     EKG  ED ECG REPORT I, Kevin Mario J, the attending physician, personally viewed and interpreted this ECG.  Date: 03/17/2022  EKG Time: ***  Rate: ***  Rhythm: {ekg findings:315101}  Axis: ***  Intervals:{conduction defects:17367}  ST&T Change: ***    RADIOLOGY I have independently visualized and interpreted patient's chest x-ray as well as noted the radiology interpretation:  X-ray: No acute cardiopulmonary process  Official radiology report(s): DG Chest 2 View  Result Date: 03/17/2022 CLINICAL DATA:  History of COVID-19.  Shortness of breath EXAM: CHEST - 2 VIEW COMPARISON:  Chest radiograph 06/27/2020. FINDINGS: Multi lead pacer apparatus overlies the left hemithorax, leads stable in position. Stable cardiac and mediastinal contours. No large area pulmonary consolidation. No pleural effusion or pneumothorax. Thoracic spine degenerative changes. IMPRESSION: No active cardiopulmonary disease. Electronically Signed   By: Lovey Newcomer M.D.   On: 03/17/2022 18:41     PROCEDURES:  Critical Care performed: {CriticalCareYesNo:19197::"Yes, see critical care procedure note(s)","No"}  .1-3 Lead EKG Interpretation  Performed by: Paulette Blanch, MD Authorized by: Paulette Blanch, MD     Interpretation: normal     ECG rate:  85   ECG rate assessment: normal     Rhythm: sinus rhythm     Ectopy: none     Conduction: normal   Comments:     Patient placed on cardiac monitor to evaluate for arrhythmias    MEDICATIONS ORDERED IN ED: Medications  chlorpheniramine-HYDROcodone 10-8 MG/5ML suspension 5 mL (has no administration in time range)  sodium chloride 0.9 % bolus 500 mL (500 mLs Intravenous New Bag/Given 03/17/22 2354)  methylPREDNISolone sodium succinate  (SOLU-MEDROL) 125 mg/2 mL injection 125 mg (125 mg Intravenous Given 03/17/22 2352)  ipratropium-albuterol (DUONEB) 0.5-2.5 (3) MG/3ML nebulizer solution 3 mL (3 mLs Nebulization Given 03/17/22 2350)     IMPRESSION / MDM / ASSESSMENT AND PLAN / ED COURSE  I reviewed the triage vital signs and the nursing notes.                             66 year old female COVID-positive on 03/07/2022 presenting with chest pain and shortness of breath. Differential includes, but is not limited to, viral syndrome, bronchitis including COPD exacerbation, pneumonia, reactive airway disease including asthma, CHF including exacerbation with or without pulmonary/interstitial edema, pneumothorax, ACS, thoracic trauma, and pulmonary embolism.  I have personally reviewed patient's records and at her PCP office visit on 02/28/2022.  Patient's presentation is most consistent with acute presentation with potential threat to life or bodily function.  The patient is on the cardiac monitor to evaluate for evidence of arrhythmia and/or significant heart rate changes.  Monitor results demonstrate normal WBC 7.38, normal electrolytes/kidney function, 2 sets of negative troponins.  X-ray is clear.  Will obtain CTA chest to evaluate for PE.  Administer DuoNeb, 125 mg IV Medrol, Tussionex and IV fluids.  Will reassess.      FINAL CLINICAL IMPRESSION(S) / ED DIAGNOSES   Final diagnoses:  Shortness of breath  Chest pain, unspecified type     Rx / DC Orders   ED Discharge Orders     None        Note:  This document was prepared using Dragon voice recognition software and may include unintentional dictation errors.

## 2022-03-18 ENCOUNTER — Emergency Department: Payer: Medicare Other

## 2022-03-18 DIAGNOSIS — R079 Chest pain, unspecified: Secondary | ICD-10-CM | POA: Diagnosis not present

## 2022-03-18 MED ORDER — ALBUTEROL SULFATE (2.5 MG/3ML) 0.083% IN NEBU: 2.5000 mg | INHALATION_SOLUTION | RESPIRATORY_TRACT | 0 refills | Status: AC | PRN

## 2022-03-18 MED ORDER — PREDNISONE 20 MG PO TABS: ORAL_TABLET | ORAL | 0 refills | Status: DC

## 2022-03-18 MED ORDER — IOHEXOL 350 MG/ML SOLN
75.0000 mL | Freq: Once | INTRAVENOUS | Status: AC | PRN
  Administered 2022-03-18: 75 mL via INTRAVENOUS

## 2022-03-18 NOTE — ED Notes (Signed)
Pt ambulated in room - SpO2 remained 95% and above with ambulation.

## 2022-03-18 NOTE — Discharge Instructions (Signed)
1.  Takes Prednisone '60mg'$  daily x 4 days. 2.  You may use Albuterol nebulizer every 4 hours as needed for difficulty breathing. 3.  Return to the ER for worsening symptoms, persistent difficulty breathing or other concerns.

## 2022-03-19 ENCOUNTER — Encounter: Payer: Medicare Other | Admitting: Physical Therapy

## 2022-03-25 ENCOUNTER — Encounter: Payer: Medicare Other | Admitting: Physical Therapy

## 2022-04-02 ENCOUNTER — Ambulatory Visit: Payer: Medicare Other | Admitting: Physical Therapy

## 2022-04-09 ENCOUNTER — Encounter: Payer: Medicare Other | Admitting: Physical Therapy

## 2022-04-17 ENCOUNTER — Encounter: Payer: Medicare Other | Admitting: Physical Therapy

## 2022-04-24 ENCOUNTER — Encounter: Payer: Medicare Other | Admitting: Physical Therapy

## 2022-05-01 ENCOUNTER — Encounter: Payer: Medicare Other | Admitting: Physical Therapy

## 2022-05-02 ENCOUNTER — Ambulatory Visit (INDEPENDENT_AMBULATORY_CARE_PROVIDER_SITE_OTHER): Payer: Medicare Other | Admitting: Pulmonary Disease

## 2022-05-02 ENCOUNTER — Encounter: Payer: Self-pay | Admitting: Pulmonary Disease

## 2022-05-02 VITALS — BP 128/78 | HR 72 | Temp 97.9°F | Ht 62.0 in | Wt 223.0 lb

## 2022-05-02 DIAGNOSIS — J449 Chronic obstructive pulmonary disease, unspecified: Secondary | ICD-10-CM | POA: Diagnosis not present

## 2022-05-02 DIAGNOSIS — R0989 Other specified symptoms and signs involving the circulatory and respiratory systems: Secondary | ICD-10-CM

## 2022-05-02 MED ORDER — BREZTRI AEROSPHERE 160-9-4.8 MCG/ACT IN AERO: 2.0000 | INHALATION_SPRAY | Freq: Two times a day (BID) | RESPIRATORY_TRACT | 0 refills | Status: DC

## 2022-05-02 NOTE — Patient Instructions (Signed)
We are going to get some breathing test to check on your lung function.  We are giving you a trial of an inhaler called Breztri this is 2 puffs twice a day.  Make sure you rinse your mouth well after you use it.  We discussed putting a little bit of baking soda in the water that you used to rinse with this helps a lot with preventing thrush.  Let us know how the Angelica Zhang works for you so that we can call the prescription into your pharmacy.  STOP THE FOLLOWING INHALERS: ADVAIR AND ATROVENT.  You may still use your rescue inhaler up to 4 times a day if needed however, with the Breztri you may not need this inhaler as much.  We will see you in follow-up in 3 months time call sooner should any new problems arise.

## 2022-05-02 NOTE — Progress Notes (Signed)
Subjective:    Patient ID: Angelica Zhang, female    DOB: 1956/05/10, 66 y.o.   MRN: [id number] Patient Care Team: Donnie Coffin, MD as PCP - General (Family Medicine)  Chief Complaint  Patient presents with   pulmonary consult    Covid 02/2022. CTA 03/18/2022. SOB with exertion, wheezing and occ prod cough with clear sputum.   HPI Angelica Zhang is a 66 year old former smoker with a 40-pack-year history of smoking (quit 2016) who presents for evaluation of dyspnea on exertion, wheezing and cough.  She is kindly referred by Dr. Tomasa Hose.  Patient previously had been evaluated by Dr. Raul Del of [facility] 6 to 7 years ago.  She states she was told at the time that she had COPD.  She did have COVID towards the end of July 2023 and noted that her shortness of breath was worse after that episode.  She had a CT scan of the chest on 18 March 2022 that showed no pulmonary emboli, no acute intrathoracic abnormality, apical predominant emphysema and that a previous right upper lobe nodule had resolved.  There were no other significant findings on the CT.  Patient states that she has been on Advair HFA 230/21, 2 inhalations at nighttime only.  She also uses Atrovent as rescue as well as albuterol as rescue.  She has a nebulizer where she uses albuterol solution.  I did make it clear to her that Atrovent is not a good rescue medication due to onset of action and mechanism of action.  She has not had any recent fevers, chills or sweats.  She has occasional wheezing and occasional productive cough of clear sputum but no hemoptysis.  Main issue is that of dyspnea on exertion.  She does note however that this is getting somewhat better.  She does note that she was treated with a prednisone taper towards the end of August and this improved her symptoms somewhat.  She has had bouts of recurrent bronchitis yearly to twice yearly for a number of years.  FTs were in 2016 during evaluation by Dr. Raul Del.  She was noted to  have an FEV1 of 1.61 L or 72% predicted, FVC of 2.10 L or 77% of predicted, FEV1/FVC of 76%.  Reduced mid flows.  Expiratory flow volume loop described as being slightly delayed consistent with mild obstruction.  She had alpha 1 antitrypsin level and phenotype that were normal according to Dr. Gust Brooms notes in 2016.   Review of Systems A 10 point review of systems was performed and it is as noted above otherwise negative.  Past Medical History:  Diagnosis Date   Acute bronchitis    Acute upper respiratory infections of unspecified site    Anxiety    Asthma    Backache, unspecified    Bradycardia    Chronic airway obstruction, not elsewhere classified    Depressive disorder, not elsewhere classified    Hypotension, unspecified    Osteoarthrosis, unspecified whether generalized or localized, other specified sites    Other and unspecified hyperlipidemia    Screening for diabetes mellitus    Sinusitis    Umbilical hernia without mention of obstruction or gangrene    Unspecified hypertrophic and atrophic condition of skin    Unspecified local infection of skin and subcutaneous tissue    Urinary calculus, unspecified    Past Surgical History:  Procedure Laterality Date   BACK SURGERY     BREAST BIOPSY Left 01/07/2017   Affirm Bx- neg  CARDIAC CATHETERIZATION  09/20/2009   [facility]   CHOLECYSTECTOMY     COLONOSCOPY WITH PROPOFOL N/A 04/23/2017   Procedure: COLONOSCOPY WITH PROPOFOL;  Surgeon: Lollie Sails, MD;  Location: [facility] ENDOSCOPY;  Service: Endoscopy;  Laterality: N/A;   COLONOSCOPY WITH PROPOFOL N/A 06/10/2021   Procedure: COLONOSCOPY WITH PROPOFOL;  Surgeon: Lesly Rubenstein, MD;  Location: [facility] ENDOSCOPY;  Service: Endoscopy;  Laterality: N/A;   HERNIA REPAIR     x 2   PACEMAKER INSERTION N/A 09/03/2016   Procedure: INSERTION PACEMAKER;  Surgeon: Isaias Cowman, MD;  Location: [facility] ORS;  Service: Cardiovascular;  Laterality: N/A;   ROTATOR CUFF REPAIR      Patient Active Problem List   Diagnosis Date Noted   Sick sinus syndrome ([place]) 09/03/2016   Bradycardia 09/20/2012   Dizziness 09/20/2012   Morbid obesity ([place]) 09/20/2012   Smoking history 09/20/2012   Family History  Problem Relation Age of Onset   Heart attack Mother    Breast cancer Neg Hx    Social History   Tobacco Use   Smoking status: Former    Packs/day: 1.00    Years: 40.00    Total pack years: 40.00    Types: Cigarettes, E-cigarettes    Quit date: 2016    Years since quitting: 7.7   Smokeless tobacco: Never  Substance Use Topics   Alcohol use: No   Allergies  Allergen Reactions   Linaclotide     Other reaction(s): Other (See Comments) Flushing   Current Meds  Medication Sig   ADVAIR HFA 230-21 MCG/ACT inhaler Inhale 2 puffs into the lungs 2 (two) times daily.   albuterol (PROVENTIL HFA;VENTOLIN HFA) 108 (90 BASE) MCG/ACT inhaler Inhale 2 puffs into the lungs every 4 (four) hours as needed for wheezing or shortness of breath.    albuterol (PROVENTIL) (2.5 MG/3ML) 0.083% nebulizer solution Take 3 mLs (2.5 mg total) by nebulization every 4 (four) hours as needed for wheezing or shortness of breath.   Aspirin 81 MG CAPS Take 1 tablet by mouth daily.   ATROVENT HFA 17 MCG/ACT inhaler Inhale 2 puffs into the lungs every 6 (six) hours as needed (for wheezing/COPD). For COPD.   Biotin 1000 MCG tablet Take 1,000 mcg by mouth 3 (three) times daily.   cetirizine (ZYRTEC) 10 MG tablet Take 10 mg by mouth at bedtime.   cholecalciferol (VITAMIN D) 1000 units tablet Take 1,000 Units by mouth at bedtime.   montelukast (SINGULAIR) 10 MG tablet Take 10 mg by mouth at bedtime.   polyethylene glycol powder (GLYCOLAX/MIRALAX) powder Take 17 g by mouth daily as needed for constipation.   [DISCONTINUED] cephALEXin (KEFLEX) 250 MG capsule Take 1 capsule (250 mg total) by mouth 4 (four) times daily.   [DISCONTINUED] predniSONE (DELTASONE) 20 MG tablet 3 tablets daily x 4 days    Immunization History  Administered Date(s) Administered   Influenza-Unspecified 05/22/2010, 07/09/2011, 06/13/2013, 08/02/2014, 05/17/2015, 10/16/2016, 06/10/2017, 07/14/2018, 06/28/2019, 05/07/2020   Tdap 03/22/2020       Objective:   Physical Exam BP 128/78 (BP Location: Left Arm, Cuff Size: Normal)   Pulse 72   Temp 97.9 F (36.6 C) (Temporal)   Ht 5\' 2"  (1.575 m)   Wt 223 lb (101.2 kg)   SpO2 97%   BMI 40.79 kg/m  GENERAL: Obese woman, no acute distress, ambulatory.  No conversational dyspnea. HEAD: Normocephalic, atraumatic.  EYES: Pupils equal, round, reactive to light.  No scleral icterus.  MOUTH: Dentition, oral mucosa moist.  No thrush.  NECK: Supple. No thyromegaly. Trachea midline. No JVD.  No adenopathy. PULMONARY: Good air entry bilaterally.  No adventitious sounds. CARDIOVASCULAR: S1 and S2. Regular rate and rhythm.  No rubs, murmurs or gallops heard.  His maker in situ ABDOMEN: Obese, otherwise benign. MUSCULOSKELETAL: No joint deformity, no clubbing, no edema.  NEUROLOGIC: No overt focal deficit, no gait disturbance, speech is fluent. SKIN: Intact,warm,dry. PSYCH: Mood and behavior normal.     Assessment & Plan:     ICD-10-CM   1. COPD suggested by initial evaluation ([place])  J44.9 Pulmonary Function Test [facility] Only   Suspect chronic bronchitis type Will obtain PFTs to clarify Trial of Breztri 2 puffs twice a day Continue as needed albuterol    2. Morbid obesity ([place])  E66.01    This issue adds complexity to her management She would benefit from weight loss     Orders Placed This Encounter  Procedures   Pulmonary Function Test [facility] Only    Standing Status:   Future    Number of Occurrences:   1    Standing Expiration Date:   10/31/2022    Order Specific Question:   Full PFT: includes the following: basic spirometry, spirometry pre & post bronchodilator, diffusion capacity (DLCO), lung volumes    Answer:   Full PFT    Order Specific Question:   This  test can only be performed at    Answer:   [facility] ordered this encounter  Medications   Budeson-Glycopyrrol-Formoterol (BREZTRI AEROSPHERE) 160-9-4.8 MCG/ACT AERO    Sig: Inhale 2 puffs into the lungs in the morning and at bedtime.    Dispense:  5.9 g    Refill:  0    Order Specific Question:   Lot Number?    Answer:   [id number]    Order Specific Question:   Expiration Date?    Answer:   07/11/2025    Order Specific Question:   Manufacturer?    Answer:   [employer] [[id number]]    Order Specific Question:   Quantity    Answer:   2   Meds ordered this encounter  Medications   Budeson-Glycopyrrol-Formoterol (BREZTRI AEROSPHERE) 160-9-4.8 MCG/ACT AERO    Sig: Inhale 2 puffs into the lungs in the morning and at bedtime.    Dispense:  5.9 g    Refill:  0    Order Specific Question:   Lot Number?    Answer:   [id number]    Order Specific Question:   Expiration Date?    Answer:   07/11/2025    Order Specific Question:   Manufacturer?    Answer:   [employer] [[id number]]    Order Specific Question:   Quantity    Answer:   2   Patient has been instructed to discontinue Advair and Atrovent.  Will give the patient trial of Breztri 2 puffs twice a day.  We will see the patient in follow-up in 3 months time she is to call sooner should any new problems arise.

## 2022-05-07 ENCOUNTER — Telehealth: Payer: Self-pay | Admitting: Pulmonary Disease

## 2022-05-07 NOTE — Telephone Encounter (Signed)
That is fine to go back to Advair.  We can switch the Atrovent to Spiriva which is basically long-acting Atrovent.  Spiriva would be 2.5 mcg 2 puffs twice a day, we can give her samples if she wishes and teach her how to use that inhaler.

## 2022-05-07 NOTE — Telephone Encounter (Signed)
Called and spoke to patient.  She stated SOB and chest congestion has worsened since starting Breztri.  She would like to resume Advair daily and Atrovent PRN.   Dr. Patsey Berthold, please advise. Thanks

## 2022-05-07 NOTE — Telephone Encounter (Signed)
Spoke to patient and relayed below message/recommendations. She would like to hold off on spiriva for now and re access at next appt. I have made Dr. Patsey Berthold aware of this verbally.  Nothing further needed.

## 2022-05-08 ENCOUNTER — Ambulatory Visit: Payer: Medicare Other | Attending: Gastroenterology | Admitting: Physical Therapy

## 2022-05-08 ENCOUNTER — Encounter: Payer: Self-pay | Admitting: Physical Therapy

## 2022-05-08 DIAGNOSIS — M533 Sacrococcygeal disorders, not elsewhere classified: Secondary | ICD-10-CM | POA: Insufficient documentation

## 2022-05-08 DIAGNOSIS — R2689 Other abnormalities of gait and mobility: Secondary | ICD-10-CM | POA: Insufficient documentation

## 2022-05-08 DIAGNOSIS — R278 Other lack of coordination: Secondary | ICD-10-CM | POA: Diagnosis present
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
## 2022-05-08 NOTE — Patient Instructions (Addendum)
Psychotherapy referrals.    Wear shoe lift in R shoe and if it causes pain, remove it

## 2022-05-15 ENCOUNTER — Ambulatory Visit: Payer: Medicare Other | Attending: Gastroenterology | Admitting: Physical Therapy

## 2022-05-15 DIAGNOSIS — R278 Other lack of coordination: Secondary | ICD-10-CM

## 2022-05-15 DIAGNOSIS — R2689 Other abnormalities of gait and mobility: Secondary | ICD-10-CM | POA: Diagnosis present

## 2022-05-15 DIAGNOSIS — M533 Sacrococcygeal disorders, not elsewhere classified: Secondary | ICD-10-CM
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
## 2022-05-15 NOTE — Patient Instructions (Signed)
   Side of hip stretch:  Reclined twist for hips and side of the hips/ legs  Lay on your back, knees bend Scoot hips to the L , leave shoulders in place Drop knees to the R side resting onto pillows to keep leg at the same width of hips Pillow under L thigh to minimize too much strain    __   Towel under L thigh to hold in The R hand,   Gentle pulling towards R armpit  10 reps

## 2022-05-22 ENCOUNTER — Ambulatory Visit: Payer: Medicare Other | Admitting: Physical Therapy

## 2022-05-22 DIAGNOSIS — R278 Other lack of coordination: Secondary | ICD-10-CM | POA: Diagnosis not present

## 2022-05-22 DIAGNOSIS — M533 Sacrococcygeal disorders, not elsewhere classified: Secondary | ICD-10-CM

## 2022-05-22 DIAGNOSIS — R2689 Other abnormalities of gait and mobility: Secondary | ICD-10-CM

## 2022-05-22 NOTE — Patient Instructions (Signed)
  _________  Feet slides :   Points of contact at sitting bones  Four points of contact of foot,  Side knee back while keeping knee out along 2-3rd toe line   Heel up, ankle not twist out Lower heel while keeping knee out along 2-3rd toe line Four points of contact of foot, Slide foot back while keeping knee out along 2-3rd toe line   Repeated with other foot    ___  >walking with higher knees, not lock knees, more ballmounds . Wide feet
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
## 2022-05-29 ENCOUNTER — Ambulatory Visit: Payer: Medicare Other | Admitting: Physical Therapy

## 2022-05-29 DIAGNOSIS — R278 Other lack of coordination: Secondary | ICD-10-CM | POA: Diagnosis not present

## 2022-05-29 DIAGNOSIS — M533 Sacrococcygeal disorders, not elsewhere classified: Secondary | ICD-10-CM

## 2022-05-29 DIAGNOSIS — R2689 Other abnormalities of gait and mobility: Secondary | ICD-10-CM

## 2022-05-29 NOTE — Patient Instructions (Signed)
Seated clam shells  Red band at thighs  In hale, exhale, move thigh out, heel down, toe/foot out  20 reps
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
## 2022-06-05 ENCOUNTER — Ambulatory Visit: Payer: Medicare Other | Admitting: Physical Therapy

## 2022-06-05 DIAGNOSIS — R278 Other lack of coordination: Secondary | ICD-10-CM

## 2022-06-05 DIAGNOSIS — M533 Sacrococcygeal disorders, not elsewhere classified: Secondary | ICD-10-CM

## 2022-06-05 DIAGNOSIS — R2689 Other abnormalities of gait and mobility: Secondary | ICD-10-CM

## 2022-06-05 NOTE — Patient Instructions (Signed)
Deep core level 1-2 ( recliner)  2 x day    ___   Clams with hands by side, to maintain upright posture, fingers point out   Inhale, exhale then move knees   20 reps    X 3 x day
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
## 2022-06-12 ENCOUNTER — Ambulatory Visit: Payer: Medicare Other | Attending: Gastroenterology | Admitting: Physical Therapy

## 2022-06-12 DIAGNOSIS — M533 Sacrococcygeal disorders, not elsewhere classified: Secondary | ICD-10-CM | POA: Diagnosis present

## 2022-06-12 DIAGNOSIS — R2689 Other abnormalities of gait and mobility: Secondary | ICD-10-CM | POA: Diagnosis present

## 2022-06-12 DIAGNOSIS — R278 Other lack of coordination: Secondary | ICD-10-CM | POA: Insufficient documentation

## 2022-06-12 NOTE — Patient Instructions (Addendum)
strap under thigh of R  Scoot R hips to R   L foot planted and knee pointed to sky for stability   Bring R knee to armpit and then   Switch strap into L hand   Cross R  over belly button line   5 breaths   ___  Restorative yoga practice with knees open, propped on pillows for relaxation and pelvic floor lengthening 10 -15 reps   ___   Avoid straining pelvic floor, abdominal muscles , spine  Use log rolling technique instead of getting out of bed with your neck or the sit-up     Log rolling into and out of bed   Log rolling into and out of bed If getting out of bed on R side, Bent knees, scoot hips/ shoulder to L  Raise R arm completely overhead, rolling onto armpit  Then lower bent knees to bed to get into complete side lying position  Then drop legs off bed, and push up onto R elbow/forearm, and use L hand to push onto the bed    Dig elbows and feet to lift hte buttocks and scoot without lifting head
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
## 2022-06-18 ENCOUNTER — Ambulatory Visit: Payer: Medicare Other | Admitting: Physical Therapy

## 2022-06-18 DIAGNOSIS — R2689 Other abnormalities of gait and mobility: Secondary | ICD-10-CM

## 2022-06-18 DIAGNOSIS — R278 Other lack of coordination: Secondary | ICD-10-CM

## 2022-06-18 DIAGNOSIS — M533 Sacrococcygeal disorders, not elsewhere classified: Secondary | ICD-10-CM
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
## 2022-06-18 NOTE — Patient Instructions (Signed)
Laying on your back  Morning , lunch,  and night      Mon Tue Wed Thurs Fri  Sat Sun  Pelvic floor  Quick contraction on exhale  X 4 reps                   Deep core level 1  10 reps                    Deep core level 2  ( knee out)  52mn)                  Quick contraction on exhale  X 4 reps

## 2022-06-25 ENCOUNTER — Ambulatory Visit: Payer: Medicare Other | Admitting: Physical Therapy

## 2022-07-05 IMAGING — CR DG CHEST 2V
1 series · 2 of 2 positions shown · non-contrast
Comparison: Prior chest radiographs 09/03/2016 and earlier.

CLINICAL DATA: Shortness of breath. Patient reports history of
COPD, asthma

EXAM:
CHEST - 2 VIEW

[Series 1: w chest pa · 0.14mm/px · 2 of 2 slices shown]
[im 1/2]
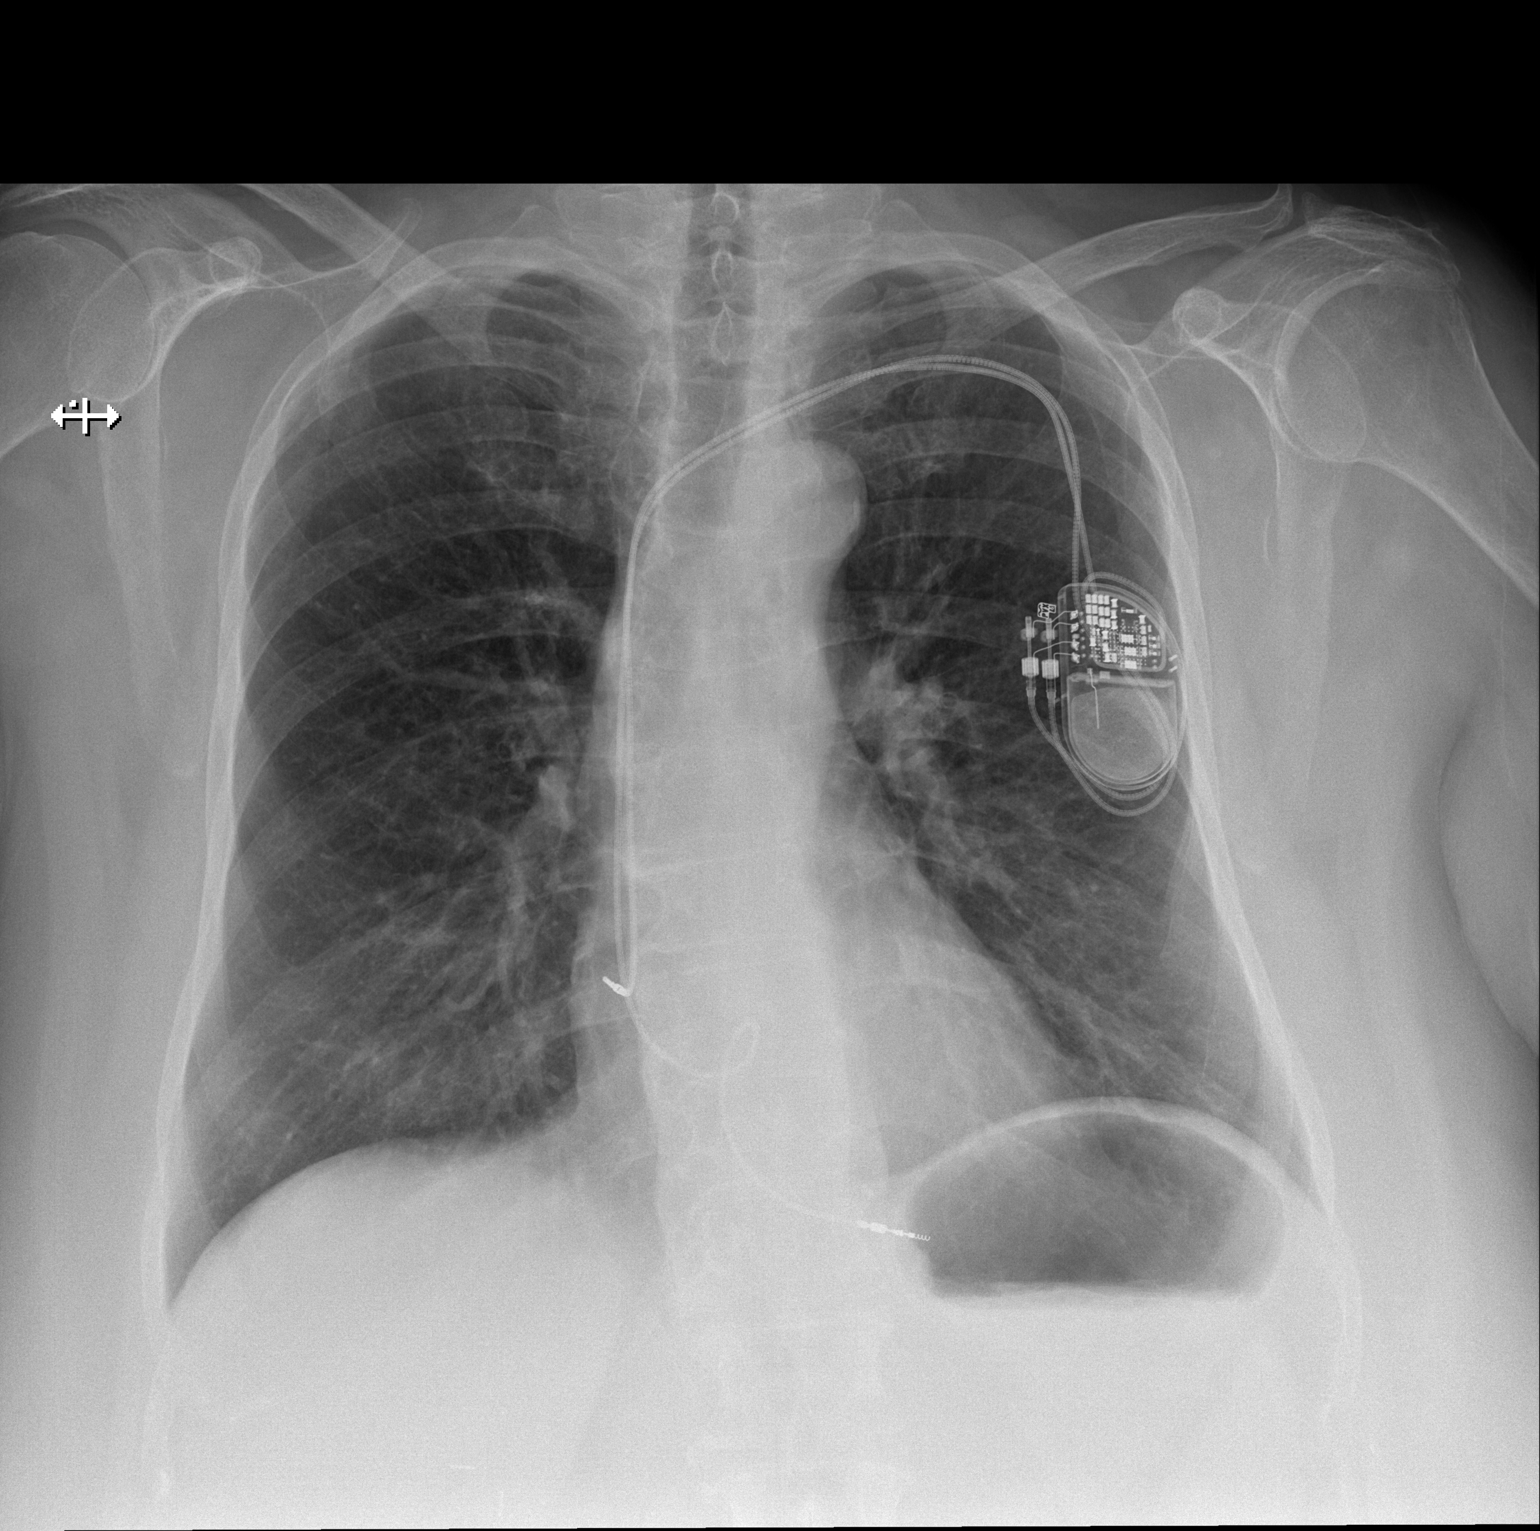
[im 2/2]
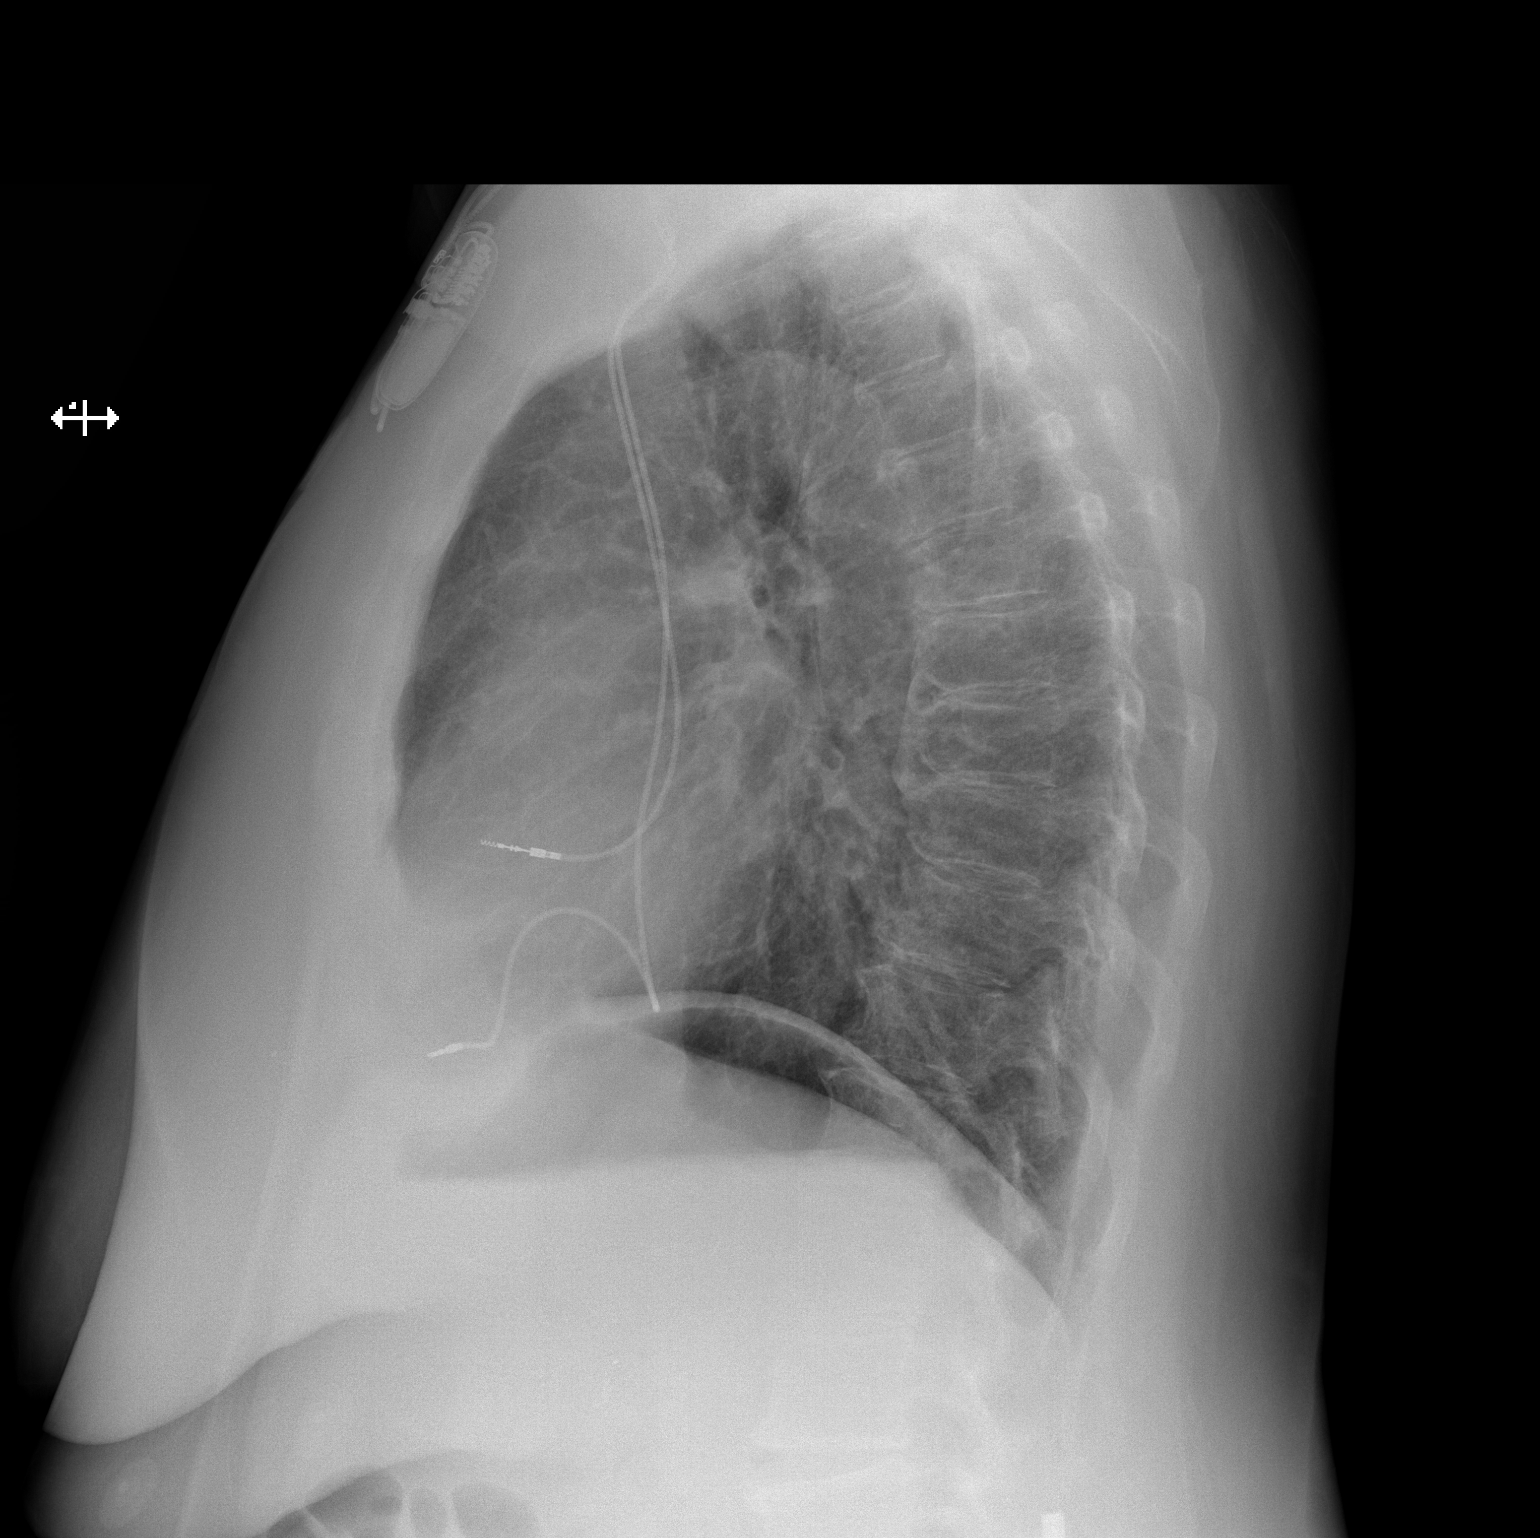

[2 of 2 positions shown; findings below may reference images not displayed]

FINDINGS: Redemonstrated dual lead left chest implantable cardiac device.
Heart size within normal limits. Aortic atherosclerosis. No
appreciable airspace consolidation or pulmonary edema. No evidence
of pleural effusion or pneumothorax. No acute bony abnormality
identified. Mild thoracic dextrocurvature
IMPRESSION: No evidence of acute cardiopulmonary abnormality.

Aortic Atherosclerosis ([id number]).

## 2022-07-10 ENCOUNTER — Ambulatory Visit: Payer: Medicare Other | Admitting: Physical Therapy

## 2022-07-10 DIAGNOSIS — R278 Other lack of coordination: Secondary | ICD-10-CM

## 2022-07-10 DIAGNOSIS — R2689 Other abnormalities of gait and mobility: Secondary | ICD-10-CM

## 2022-07-10 DIAGNOSIS — M533 Sacrococcygeal disorders, not elsewhere classified: Secondary | ICD-10-CM

## 2022-07-10 NOTE — Patient Instructions (Addendum)
PELVIC FLOOR / KEGEL EXERCISES   Pelvic floor/ Kegel exercises are used to strengthen the muscles in the base of your pelvis that are responsible for supporting your pelvic organs and preventing urine/feces leakage. Based on your therapist's recommendations, they can be performed while standing, sitting, or lying down. Imagine pelvic floor area as a diamond with pelvic landmarks: top =pubic bone, bottom tip=tailbone, sides=sitting bones (ischial tuberosities).    Make yourself aware of this muscle group by using these cues while coordinating your breath: Inhale, feel pelvic floor diamond area lower like hammock towards your feet and ribcage/belly expanding. Pause. Let the exhale naturally and feel your belly sink, abdominal muscles hugging in around you and you may notice the pelvic diamond draws upward towards your head forming a umbrella shape. Give a squeeze during the exhalation like you are stopping the flow of urine. If you are squeezing the buttock muscles, try to give 50% less effort.   Make yourself aware of this muscle group by using these cues: Blueberry imagery Telescope imagery Arcade machine plush animal suction lift  Jelly fish lift   Focus on the perineum, the center of the pelvic floor muscles to engage the whole set of muscles , lowering like a bowl on inhale as ribs expand like tree rings, on exhale, pelvic at the perineum gently lifts , low belly sinks, upper belly sinks.   Common Errors: Breath holding: If you are holding your breath, you may be bearing down against your bladder instead of pulling it up. If you belly bulges up while you are squeezing, you are holding your breath. Be sure to breathe gently in and out while exercising. Counting out loud may help you avoid holding your breath. Accessory muscle use: You should not see or feel other muscle movement when performing pelvic floor exercises. When done properly, no one can tell that you are performing the exercises.  Keep the buttocks, belly and inner thighs relaxed. Overdoing it: Your muscles can fatigue and stop working for you if you over-exercise. You may actually leak more or feel soreness at the lower abdomen or rectum.  YOUR HOME EXERCISE PROGRAM  SHORT HOLDS: Position: on sitting  Inhale and then exhale. Then squeeze the muscle.  (Be sure to let belly sink in with exhales and not push outward) Perform 3 repetitions, 4 different  Times/day                      DECREASE DOWNWARD PRESSURE ON  YOUR PELVIC FLOOR, ABDOMINAL, LOW BACK MUSCLES       PRESERVE YOUR PELVIC HEALTH LONG-TERM   ** SQUEEZE pelvic floor BEFORE YOUR SNEEZE, COUGH, LAUGH   ** EXHALE BEFORE YOU RISE AGAINST GRAVITY (lifting, sit to stand, from squat to stand)   ** LOG ROLL OUT OF BED INSTEAD OF CRUNCH/SIT-UP   __  Side step with half a foot ,so foot is under hip  Minisquat: Scoot buttocks back slight, hinge like you are looking at your reflection on a pond  Knees behind toes,  Inhale to "smell flowers"  Exhale on the rise "like rocket"  Do not lock knees, have more weight across ballmounds of feet, toes relaxed   THEN HALF step on both feet first lap with left foot leading down a hall way = 1 lap  Repeated with other foot leading =1 lap  2 laps each side
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
## 2022-07-22 ENCOUNTER — Ambulatory Visit: Payer: Medicare Other | Admitting: Physical Therapy

## 2022-07-24 ENCOUNTER — Ambulatory Visit: Payer: Medicare Other

## 2022-07-29 ENCOUNTER — Ambulatory Visit: Payer: Medicare Other | Admitting: Physical Therapy

## 2022-08-05 ENCOUNTER — Ambulatory Visit: Payer: Medicare Other | Admitting: Physical Therapy

## 2022-08-06 ENCOUNTER — Ambulatory Visit: Payer: Medicare Other | Admitting: Pulmonary Disease

## 2022-08-09 ENCOUNTER — Emergency Department: Payer: Medicare Other

## 2022-08-09 ENCOUNTER — Emergency Department
Admission: EM | Admit: 2022-08-09 | Discharge: 2022-08-09 | Disposition: A | Discharge status: Home / Self Care | Payer: Medicare Other | Attending: Emergency Medicine | Admitting: Emergency Medicine

## 2022-08-09 ENCOUNTER — Other Ambulatory Visit: Payer: Self-pay

## 2022-08-09 DIAGNOSIS — Z1152 Encounter for screening for COVID-19: Secondary | ICD-10-CM | POA: Diagnosis not present

## 2022-08-09 DIAGNOSIS — M791 Myalgia, unspecified site: Secondary | ICD-10-CM | POA: Insufficient documentation

## 2022-08-09 DIAGNOSIS — R0602 Shortness of breath: Secondary | ICD-10-CM | POA: Insufficient documentation

## 2022-08-09 DIAGNOSIS — Z7982 Long term (current) use of aspirin: Secondary | ICD-10-CM | POA: Diagnosis not present

## 2022-08-09 DIAGNOSIS — J020 Streptococcal pharyngitis: Secondary | ICD-10-CM | POA: Diagnosis not present

## 2022-08-09 DIAGNOSIS — J029 Acute pharyngitis, unspecified: Secondary | ICD-10-CM | POA: Diagnosis present

## 2022-08-09 LAB — RESP PANEL BY RT-PCR (RSV, FLU A&B, COVID)  RVPGX2
Influenza A by PCR: NEGATIVE
Influenza B by PCR: NEGATIVE
Resp Syncytial Virus by PCR: NEGATIVE
SARS Coronavirus 2 by RT PCR: NEGATIVE

## 2022-08-09 LAB — GROUP A STREP BY PCR: Group A Strep by PCR: DETECTED — AB

## 2022-08-09 MED ORDER — AMOXICILLIN-POT CLAVULANATE 875-125 MG PO TABS: 1.0000 | ORAL_TABLET | Freq: Two times a day (BID) | ORAL | 0 refills | Status: AC

## 2022-08-09 NOTE — ED Provider Triage Note (Signed)
Emergency Medicine Provider Triage Evaluation Note  LAYSA KIMMEY , a 66 y.o. female  was evaluated in triage.  Pt complains of sore throat and SOB that began today. Daughter had strep throat. No CP. Reports body aches. Tmax 100.25F  Review of Systems  Positive: Sore throat, cough, aches Negative: N/v/d, cp/abd pain  Physical Exam  There were no vitals taken for this visit. Gen:   Awake, no distress   Resp:  Normal effort  MSK:   Moves extremities without difficulty  Other:    Medical Decision Making  Medically screening exam initiated at 2:43 PM.  Appropriate orders placed.  Zelphia Cairo was informed that the remainder of the evaluation will be completed by another provider, this initial triage assessment does not replace that evaluation, and the importance of remaining in the ED until their evaluation is complete.     Marquette Old, PA-C 08/09/22 1445

## 2022-08-09 NOTE — ED Provider Notes (Signed)
[place]  EMERGENCY DEPARTMENT Provider Note   CSN: [id number] Arrival date & time: 08/09/22  1426     History  Chief Complaint  Patient presents with   Shortness of Breath   Sore Throat    Angelica Zhang is a 66 y.o. female.  Presents to the emergency department for evaluation of shortness of breath that occurred this morning.  She states that this has resolved, she states her throat has felt really swollen and she felt like she was not having a hard time breathing.  She has been exposed to strep, she has severe sore throat, body aches, chills, fevers.  She is currently afebrile.  She is taking very little medications.  She is tolerating p.o. well.  Denies any current chest pain or shortness of breath.  No coughing.  HPI     Home Medications Prior to Admission medications   Medication Sig Start Date End Date Taking? Authorizing Provider  amoxicillin-clavulanate (AUGMENTIN) 875-125 MG tablet Take 1 tablet by mouth 2 (two) times daily for 10 days. 08/09/22 08/19/22 Yes Duanne Guess, PA-C  albuterol (PROVENTIL HFA;VENTOLIN HFA) 108 (90 BASE) MCG/ACT inhaler Inhale 2 puffs into the lungs every 4 (four) hours as needed for wheezing or shortness of breath.     [provider]  albuterol (PROVENTIL) (2.5 MG/3ML) 0.083% nebulizer solution Take 3 mLs (2.5 mg total) by nebulization every 4 (four) hours as needed for wheezing or shortness of breath. 03/18/22   Paulette Blanch, MD  Aspirin 81 MG CAPS Take 1 tablet by mouth daily.    [provider]  Biotin 1000 MCG tablet Take 1,000 mcg by mouth 3 (three) times daily.    [provider]  Budeson-Glycopyrrol-Formoterol (BREZTRI AEROSPHERE) 160-9-4.8 MCG/ACT AERO Inhale 2 puffs into the lungs in the morning and at bedtime. 05/02/22   Tyler Pita, MD  cetirizine (ZYRTEC) 10 MG tablet Take 10 mg by mouth at bedtime. 08/12/16   [provider]  cholecalciferol (VITAMIN D) 1000 units tablet  Take 1,000 Units by mouth at bedtime.    [provider]  montelukast (SINGULAIR) 10 MG tablet Take 10 mg by mouth at bedtime. 03/17/15   [provider]  polyethylene glycol powder (GLYCOLAX/MIRALAX) powder Take 17 g by mouth daily as needed for constipation. 06/26/16   [provider]      Allergies    Linaclotide    Review of Systems   Review of Systems  Physical Exam Updated Vital Signs BP 126/81   Pulse 77   Temp 98 F (36.7 C)   Resp 19   SpO2 98%  Physical Exam Constitutional:      Appearance: She is well-developed.  HENT:     Head: Normocephalic and atraumatic.     Mouth/Throat:     Pharynx: Uvula midline. Oropharyngeal exudate and posterior oropharyngeal erythema present. No uvula swelling.     Tonsils: Tonsillar exudate present. No tonsillar abscesses.     Comments: Positive pharyngeal erythema and exudates with no signs of peritonsillar abscess. Eyes:     Conjunctiva/sclera: Conjunctivae normal.  Cardiovascular:     Rate and Rhythm: Normal rate.  Pulmonary:     Effort: Pulmonary effort is normal. No respiratory distress.     Breath sounds: Normal breath sounds. No stridor. No wheezing, rhonchi or rales.  Chest:     Chest wall: No tenderness.  Musculoskeletal:        General: Normal range of motion.  Cervical back: Normal range of motion.  Skin:    General: Skin is warm.     Findings: No rash.  Neurological:     Mental Status: She is alert and oriented to person, place, and time.  Psychiatric:        Behavior: Behavior normal.        Thought Content: Thought content normal.     ED Results / Procedures / Treatments   Labs (all labs ordered are listed, but only abnormal results are displayed) Labs Reviewed  GROUP A STREP BY PCR - Abnormal; Notable for the following components:      Result Value   Group A Strep by PCR DETECTED (*)    All other components within normal limits  RESP PANEL BY RT-PCR (RSV, FLU A&B, COVID)   [id number]    EKG None  Radiology DG Chest 2 View  Result Date: 08/09/2022 CLINICAL DATA:  Shortness of breath EXAM: CHEST - 2 VIEW COMPARISON:  03/17/2022 FINDINGS: Left-sided implanted cardiac device. The heart size and mediastinal contours are within normal limits. Aortic atherosclerosis. Both lungs are clear. The visualized skeletal structures are unremarkable. IMPRESSION: No active cardiopulmonary disease. Electronically Signed   By: Davina Poke D.O.   On: 08/09/2022 15:24    Procedures Procedures    Medications Ordered in ED Medications - No data to display  ED Course/ Medical Decision Making/ A&P                           Medical Decision Making Risk Prescription drug management.   66 year old female positive for strep pharyngitis.  Severe sore throat, chills body aches.  Strep test positive.  Negative flu COVID RSV.  Negative chest x-ray.  Her vital signs are stable, afebrile with normal heart rate and O2 sats.  She appears well and comfortable.  No complaints of chest pain or shortness of breath at this time.  No signs of peritonsillar abscess.  She was treated with Augmentin and will alternate Tylenol and ibuprofen.  She understands signs symptoms return to the ER for. Final Clinical Impression(s) / ED Diagnoses Final diagnoses:  Strep pharyngitis    Rx / DC Orders ED Discharge Orders          Ordered    amoxicillin-clavulanate (AUGMENTIN) 875-125 MG tablet  2 times daily        08/09/22 1705              Renata Caprice 08/09/22 1709    Nena Polio, MD 08/09/22 [id number]

## 2022-08-09 NOTE — ED Triage Notes (Signed)
Pt comes with c/o sob and sore throat since last night. Pt states daughter was sick with strep over Christmas. Pt states fever of 100.3 and achy all over. Pt states no CP. Pt states nausea. Pt states throat feels swollen.  Pt does have hx of copd

## 2022-08-09 NOTE — Discharge Instructions (Signed)
Please take antibiotic as prescribed x 10 days.  Take 1000 mg of Tylenol every 6 hours along with ibuprofen 400 mg every 6 hours.  Make sure you are drinking lots of fluids.  Return to the ER for any difficulty swallowing, shortness of breath or any urgent changes in your health.

## 2022-08-13 ENCOUNTER — Encounter: Payer: Medicare Other | Admitting: Physical Therapy

## 2022-08-20 ENCOUNTER — Encounter: Payer: Medicare Other | Admitting: Physical Therapy

## 2022-08-26 ENCOUNTER — Ambulatory Visit: Payer: 59 | Attending: Pulmonary Disease

## 2022-08-26 DIAGNOSIS — Z87891 Personal history of nicotine dependence: Secondary | ICD-10-CM | POA: Diagnosis not present

## 2022-08-26 DIAGNOSIS — R058 Other specified cough: Secondary | ICD-10-CM | POA: Diagnosis not present

## 2022-08-26 DIAGNOSIS — J449 Chronic obstructive pulmonary disease, unspecified: Secondary | ICD-10-CM | POA: Diagnosis not present

## 2022-08-26 LAB — PULMONARY FUNCTION TEST ARMC ONLY
DL/VA % pred: 109 %
DL/VA: 4.66 ml/min/mmHg/L
DLCO unc % pred: 110 %
DLCO unc: 20.46 ml/min/mmHg
FEF 25-75 Post: 1.92 L/sec
FEF 25-75 Pre: 1.47 L/sec
FEF2575-%Change-Post: 30 %
FEF2575-%Pred-Post: 95 %
FEF2575-%Pred-Pre: 72 %
FEV1-%Change-Post: 7 %
FEV1-%Pred-Post: 77 %
FEV1-%Pred-Pre: 72 %
FEV1-Post: 1.74 L
FEV1-Pre: 1.62 L
FEV1FVC-%Change-Post: 1 %
FEV1FVC-%Pred-Pre: 102 %
FEV6-%Change-Post: 5 %
FEV6-%Pred-Post: 76 %
FEV6-%Pred-Pre: 73 %
FEV6-Post: 2.16 L
FEV6-Pre: 2.05 L
FEV6FVC-%Change-Post: 0 %
FEV6FVC-%Pred-Post: 104 %
FEV6FVC-%Pred-Pre: 104 %
FVC-%Change-Post: 5 %
FVC-%Pred-Post: 73 %
FVC-%Pred-Pre: 70 %
FVC-Post: 2.16 L
FVC-Pre: 2.05 L
Post FEV1/FVC ratio: 81 %
Post FEV6/FVC ratio: 100 %
Pre FEV1/FVC ratio: 79 %
Pre FEV6/FVC Ratio: 100 %
RV % pred: 115 %
RV: 2.3 L
TLC % pred: 98 %
TLC: 4.7 L

## 2022-09-03 ENCOUNTER — Encounter: Payer: Self-pay | Admitting: Pulmonary Disease

## 2022-09-03 ENCOUNTER — Ambulatory Visit (INDEPENDENT_AMBULATORY_CARE_PROVIDER_SITE_OTHER): Payer: 59 | Admitting: Pulmonary Disease

## 2022-09-03 VITALS — BP 126/74 | HR 90 | Temp 97.9°F | Ht 62.0 in | Wt 221.0 lb

## 2022-09-03 DIAGNOSIS — R0602 Shortness of breath: Secondary | ICD-10-CM | POA: Diagnosis not present

## 2022-09-03 DIAGNOSIS — J4530 Mild persistent asthma, uncomplicated: Secondary | ICD-10-CM | POA: Diagnosis not present

## 2022-09-03 LAB — NITRIC OXIDE: Nitric Oxide: 6

## 2022-09-03 NOTE — Patient Instructions (Signed)
Your issues with a longer more related to asthma and to overweight.  If there is a COPD component that is very minimal.  I recommend that you keep taking your Advair 2 puffs twice a day make sure you rinse your mouth well after you use it  Use your as needed albuterol.  You can use Atrovent as needed for cough.  We will see you in follow-up in 4 to 6 months time call sooner should any problems arise.

## 2022-09-03 NOTE — Progress Notes (Signed)
Subjective:    Patient ID: Angelica Zhang, female    DOB: 02-May-1956, 67 y.o.   MRN: [id number] Patient Care Team: Donnie Coffin, MD as PCP - General (Family Medicine)  Chief Complaint  Patient presents with   Follow-up    SOB with exertion and occ wheezing.     HPI This is a 67 year old former smoker with 40-pack-year history of smoking and history as noted below who presents for follow-up on the issue of dyspnea on exertion, wheezing and cough.  She was initially evaluated here on 02 May 2022.  She had pulmonary function testing performed on 26 August 2022 that is more consistent with mild asthma and mild restriction due to obesity.  Diffusion capacity was normal.  At her initial visit we provided the patient with a trial of Breztri however she resume using her Advair as she felt that this was more beneficial to her.  She is however using her Advair only as needed.  She does note also that her dyspnea has improved since her prior visit.  Of note she had COVID in July 2023 and had difficulties with shortness of breath after that but as noted since her prior visit she notes marked improvement.  She does not endorse any fevers, chills or sweats.  No chest pain.  No orthopnea or paroxysmal nocturnal dyspnea.  No lower extremity edema or calf pain.  Overall she feels well and looks well today.   Review of Systems A 10 point review of systems was performed and it is as noted above otherwise negative.  Patient Active Problem List   Diagnosis Date Noted   Sick sinus syndrome ([place]) 09/03/2016   Bradycardia 09/20/2012   Dizziness 09/20/2012   Morbid obesity ([place]) 09/20/2012   Smoking history 09/20/2012   Social History   Tobacco Use   Smoking status: Former    Packs/day: 1.00    Years: 40.00    Total pack years: 40.00    Types: Cigarettes, E-cigarettes    Quit date: 2016    Years since quitting: 8.0   Smokeless tobacco: Never  Substance Use Topics   Alcohol use: No    Allergies  Allergen Reactions   Linaclotide     Other reaction(s): Other (See Comments) Flushing   Current Meds  Medication Sig   ADVAIR HFA 230-21 MCG/ACT inhaler 1 puff 2 (two) times daily.   albuterol (PROVENTIL HFA;VENTOLIN HFA) 108 (90 BASE) MCG/ACT inhaler Inhale 2 puffs into the lungs every 4 (four) hours as needed for wheezing or shortness of breath.    albuterol (PROVENTIL) (2.5 MG/3ML) 0.083% nebulizer solution Take 3 mLs (2.5 mg total) by nebulization every 4 (four) hours as needed for wheezing or shortness of breath.   Aspirin 81 MG CAPS Take 1 tablet by mouth daily.   Biotin 1000 MCG tablet Take 1,000 mcg by mouth 3 (three) times daily.   cetirizine (ZYRTEC) 10 MG tablet Take 10 mg by mouth at bedtime.   cholecalciferol (VITAMIN D) 1000 units tablet Take 1,000 Units by mouth at bedtime.   montelukast (SINGULAIR) 10 MG tablet Take 10 mg by mouth at bedtime.   polyethylene glycol powder (GLYCOLAX/MIRALAX) powder Take 17 g by mouth daily as needed for constipation.   Immunization History  Administered Date(s) Administered   Influenza-Unspecified 05/22/2010, 07/09/2011, 06/13/2013, 08/02/2014, 05/17/2015, 10/16/2016, 06/10/2017, 07/14/2018, 06/28/2019, 05/07/2020, 05/19/2022   Tdap 03/22/2020      Objective:   Physical Exam BP 126/74 (BP Location: Left Arm, Cuff Size: Large)  Pulse 90   Temp 97.9 F (36.6 C) (Temporal)   Ht '5\' 2"'$  (1.575 m)   Wt 221 lb (100.2 kg)   SpO2 96%   BMI 40.42 kg/m   SpO2: 96 % O2 Device: None (Room air)  GENERAL: Obese woman, no acute distress, fully ambulatory, no conversational dyspnea. HEAD: Normocephalic, atraumatic.  EYES: Pupils equal, round, reactive to light.  No scleral icterus.  MOUTH: Dentition intact, oral mucosa moist.  No thrush. NECK: Supple. No thyromegaly. Trachea midline. No JVD.  No adenopathy. PULMONARY: Good air entry bilaterally.  No adventitious sounds. CARDIOVASCULAR: S1 and S2. Regular rate and rhythm.  No  rubs, murmurs or gallops heard.   ABDOMEN: Significant truncal obesity.  Otherwise benign. MUSCULOSKELETAL: No joint deformity, no clubbing, no edema.  NEUROLOGIC: No overt focal deficit, no gait disturbance, speech is fluent. SKIN: Intact,warm,dry. PSYCH: Mood and behavior normal  Recent Results (from the past 2160 hour(s))  Group A Strep by PCR     Status: Abnormal   Collection Time: 08/09/22  2:45 PM   Specimen: Throat; Sterile Swab  Result Value Ref Range   Group A Strep by PCR DETECTED (A) NOT DETECTED    Comment: Performed at [facility], [address] [place], [place] [address]  Resp panel by RT-PCR (RSV, Flu A&B, Covid) Throat     Status: None   Collection Time: 08/09/22  2:45 PM   Specimen: Throat; Nasal Swab  Result Value Ref Range   SARS Coronavirus 2 by RT PCR NEGATIVE NEGATIVE    Comment: (NOTE) SARS-CoV-2 target nucleic acids are NOT DETECTED.  The SARS-CoV-2 RNA is generally detectable in upper respiratory specimens during the acute phase of infection. The lowest concentration of SARS-CoV-2 viral copies this assay can detect is 138 copies/mL. A negative result does not preclude SARS-Cov-2 infection and should not be used as the sole basis for treatment or other patient management decisions. A negative result may occur with  improper specimen collection/handling, submission of specimen other than nasopharyngeal swab, presence of viral mutation(s) within the areas targeted by this assay, and inadequate number of viral copies(<138 copies/mL). A negative result must be combined with clinical observations, patient history, and epidemiological information. The expected result is Negative.  Fact Sheet for Patients:  EntrepreneurPulse.com.au  Fact Sheet for Healthcare Providers:  IncredibleEmployment.be  This test is no t yet approved or cleared by the [place] FDA and  has been authorized for detection and/or  diagnosis of SARS-CoV-2 by FDA under an Emergency Use Authorization (EUA). This EUA will remain  in effect (meaning this test can be used) for the duration of the COVID-19 declaration under Section 564(b)(1) of the Act, 21 U.S.C.section 360bbb-3(b)(1), unless the authorization is terminated  or revoked sooner.       Influenza A by PCR NEGATIVE NEGATIVE   Influenza B by PCR NEGATIVE NEGATIVE    Comment: (NOTE) The Xpert Xpress SARS-CoV-2/FLU/RSV plus assay is intended as an aid in the diagnosis of influenza from Nasopharyngeal swab specimens and should not be used as a sole basis for treatment. Nasal washings and aspirates are unacceptable for Xpert Xpress SARS-CoV-2/FLU/RSV testing.  Fact Sheet for Patients: EntrepreneurPulse.com.au  Fact Sheet for Healthcare Providers: IncredibleEmployment.be  This test is not yet approved or cleared by the [place] FDA and has been authorized for detection and/or diagnosis of SARS-CoV-2 by FDA under an Emergency Use Authorization (EUA). This EUA will remain in effect (meaning this test can be used) for the duration of  the COVID-19 declaration under Section 564(b)(1) of the Act, 21 U.S.C. section 360bbb-3(b)(1), unless the authorization is terminated or revoked.     Resp Syncytial Virus by PCR NEGATIVE NEGATIVE    Comment: (NOTE) Fact Sheet for Patients: EntrepreneurPulse.com.au  Fact Sheet for Healthcare Providers: IncredibleEmployment.be  This test is not yet approved or cleared by the [place] FDA and has been authorized for detection and/or diagnosis of SARS-CoV-2 by FDA under an Emergency Use Authorization (EUA). This EUA will remain in effect (meaning this test can be used) for the duration of the COVID-19 declaration under Section 564(b)(1) of the Act, 21 U.S.C. section 360bbb-3(b)(1), unless the authorization is terminated or revoked.  Performed  at [facility], [address] [place], [place] [address]   Pulmonary Function Test [facility] Only     Status: None   Collection Time: 08/26/22  4:39 PM  Result Value Ref Range   FVC-Pre 2.05 L   FVC-%Pred-Pre 70 %   FVC-Post 2.16 L   FVC-%Pred-Post 73 %   FVC-%Change-Post 5 %   FEV1-Pre 1.62 L   FEV1-%Pred-Pre 72 %   FEV1-Post 1.74 L   FEV1-%Pred-Post 77 %   FEV1-%Change-Post 7 %   FEV6-Pre 2.05 L   FEV6-%Pred-Pre 73 %   FEV6-Post 2.16 L   FEV6-%Pred-Post 76 %   FEV6-%Change-Post 5 %   Pre FEV1/FVC ratio 79 %   FEV1FVC-%Pred-Pre 102 %   Post FEV1/FVC ratio 81 %   FEV1FVC-%Change-Post 1 %   Pre FEV6/FVC Ratio 100 %   FEV6FVC-%Pred-Pre 104 %   Post FEV6/FVC ratio 100 %   FEV6FVC-%Pred-Post 104 %   FEV6FVC-%Change-Post 0 %   FEF 25-75 Pre 1.47 L/sec   FEF2575-%Pred-Pre 72 %   FEF 25-75 Post 1.92 L/sec   FEF2575-%Pred-Post 95 %   FEF2575-%Change-Post 30 %   RV 2.30 L   RV % pred 115 %   TLC 4.70 L   TLC % pred 98 %   DLCO unc 20.46 ml/min/mmHg   DLCO unc % pred 110 %   DL/VA 4.66 ml/min/mmHg/L   DL/VA % pred 109 %  Nitric oxide     Status: None   Collection Time: 09/03/22 10:48 AM  Result Value Ref Range   Nitric Oxide 6    PFTs consistent with minimal obstructive airways disease, asthmatic type, restriction due to obesity.  Nitric oxide result today does not show type II inflammation.  Studies were discussed with the patient.     Assessment & Plan:     ICD-10-CM   1. Mild persistent asthma without complication  T34.28    Continue Advair 2 puffs twice a day Continue albuterol as needed    2. SOB (shortness of breath)  R06.02 Nitric oxide   Suspect mostly driven by obesity and deconditioning Airway disease very mild    3. Morbid obesity ([place])  E66.01    Weight loss recommended Patient receptive to recommendations     Orders Placed This Encounter  Procedures   Nitric oxide   The patient appears to have a primary asthmatic component rather than  COPD component.  She has been instructed on the proper use of inhalers and we discussed the results of all of that laboratory data.  Will see the patient in follow-up in 4 to 6 months time she is to contact us prior to that time should any difficulties arise.   Renold Don, MD Advanced Bronchoscopy PCCM [facility]    *This note was dictated using  voice recognition software/Dragon.  Despite best efforts to proofread, errors can occur which can change the meaning. Any transcriptional errors that result from this process are unintentional and may not be fully corrected at the time of dictation.

## 2022-11-20 ENCOUNTER — Telehealth: Payer: Self-pay | Admitting: Pulmonary Disease

## 2022-11-20 NOTE — Telephone Encounter (Signed)
Called and spoke to patient.  She stated that she was tx with abx and prednisone on 11/06/2022 for bronchitis and sinusitis. She stated that sx mildly improved but did not completely subside.  C/o bilateral ear pain, increased SOB with exertion, prod cough with tan sputum, upper left-sided back discomfort,  and wheezing. Denied f/c/s or additional sx.  Using albuterol TID and Advair BID.  Neg covid and flu 11/06/2022.   Dr, Jayme Cloud, please advise. Thanks

## 2022-11-20 NOTE — Telephone Encounter (Signed)
Pt went to urgent care at the end of March was given antibiotics and prednisone. States it didn't clear up the bronchitis. Says she has a wet cough. Increased SOB

## 2022-11-20 NOTE — Telephone Encounter (Signed)
She will need to be seen either urgent care or ED as I suspect she needs chest x-ray.  Medrol and antibiotics since she has failed that therapy.  Need to make sure she does not have pneumonia.

## 2022-11-20 NOTE — Telephone Encounter (Signed)
Patient stated she was seen at [facility] and was prescribed amoxicillin.

## 2022-11-20 NOTE — Telephone Encounter (Signed)
Patient is aware of recommendations and voiced her understanding.  She will go to UC.  Nothing further needed.  

## 2022-11-20 NOTE — Telephone Encounter (Signed)
I cannot find records of her urgent care visit.  Does she recall what antibiotic she received?Marland Kitchen

## 2022-12-29 ENCOUNTER — Other Ambulatory Visit
Admission: RE | Admit: 2022-12-29 | Discharge: 2022-12-29 | Disposition: A | Discharge status: Home / Self Care | Payer: 59 | Source: Ambulatory Visit | Attending: Pulmonary Disease | Admitting: Pulmonary Disease

## 2022-12-29 ENCOUNTER — Ambulatory Visit (INDEPENDENT_AMBULATORY_CARE_PROVIDER_SITE_OTHER): Payer: 59 | Admitting: Pulmonary Disease

## 2022-12-29 ENCOUNTER — Encounter: Payer: Self-pay | Admitting: Pulmonary Disease

## 2022-12-29 VITALS — BP 120/78 | HR 78 | Temp 97.8°F | Ht 62.0 in | Wt 226.8 lb

## 2022-12-29 DIAGNOSIS — Z9189 Other specified personal risk factors, not elsewhere classified: Secondary | ICD-10-CM

## 2022-12-29 DIAGNOSIS — R635 Abnormal weight gain: Secondary | ICD-10-CM | POA: Insufficient documentation

## 2022-12-29 DIAGNOSIS — R0602 Shortness of breath: Secondary | ICD-10-CM | POA: Diagnosis not present

## 2022-12-29 DIAGNOSIS — J4530 Mild persistent asthma, uncomplicated: Secondary | ICD-10-CM

## 2022-12-29 LAB — TSH: TSH: 1.949 u[IU]/mL (ref 0.350–4.500)

## 2022-12-29 LAB — T4, FREE: Free T4: 0.77 ng/dL (ref 0.61–1.12)

## 2022-12-29 LAB — NITRIC OXIDE: Nitric Oxide: 10

## 2022-12-29 MED ORDER — TRELEGY ELLIPTA 100-62.5-25 MCG/ACT IN AEPB: 1.0000 | INHALATION_SPRAY | Freq: Every day | RESPIRATORY_TRACT | 0 refills | Status: DC

## 2022-12-29 MED ORDER — ALBUTEROL SULFATE HFA 108 (90 BASE) MCG/ACT IN AERS: 2.0000 | INHALATION_SPRAY | Freq: Four times a day (QID) | RESPIRATORY_TRACT | 2 refills | Status: DC | PRN

## 2022-12-29 NOTE — Patient Instructions (Signed)
We are going to order a sleep study, we also ordered some blood work that you can have drawn at the lab.  I sent in a prescription for the inhaler (rescue albuterol) however, the program is not allowing me to put in brand-name so you may need to let the pharmacist know that you prefer the Ventolin (blue 1).  We sent in a prescription for a new nebulizer machine.  We are giving you a trial of an inhaler called Trelegy this is 1 puff daily.  Make sure you rinse your mouth well after you use it.  Do not use the Advair while you are using the Trelegy.  Let us know how you do with this inhaler.  We can call in a prescription for you if the inhaler is better for you.  We will see you in follow-up in 2 to 3 months time call sooner should any new problems arise.

## 2022-12-29 NOTE — Progress Notes (Signed)
Subjective:    Patient ID: Angelica Zhang, female    DOB: Dec 15, 1955, 67 y.o.   MRN: [id number] Patient Care Team: [name], MD as PCP - General (Family Medicine) [name], MD as Consulting Physician (Pulmonary Disease)  Chief Complaint  Patient presents with  . Follow-up    Breathing is the same. SOB with exertion. Occasional wheezing. No cough.    HPI    Review of Systems A 10 point review of systems was performed and it is as noted above otherwise negative.  Patient Active Problem List   Diagnosis Date Noted  . Sick sinus syndrome ([facility]) 09/03/2016  . Bradycardia 09/20/2012  . Dizziness 09/20/2012  . Morbid obesity ([facility]) 09/20/2012  . Smoking history 09/20/2012   Social History   Tobacco Use  . Smoking status: Former    Packs/day: 1.00    Years: 40.00    Additional pack years: 0.00    Total pack years: 40.00    Types: Cigarettes, E-cigarettes    Quit date: 2016    Years since quitting: 8.3  . Smokeless tobacco: Never  Substance Use Topics  . Alcohol use: No   Allergies  Allergen Reactions  . Linaclotide     Other reaction(s): Other (See Comments) Flushing   Current Meds  Medication Sig  . ADVAIR HFA 230-21 MCG/ACT inhaler 1 puff 2 (two) times daily.  Marland Kitchen albuterol (PROVENTIL HFA;VENTOLIN HFA) 108 (90 Zhang) MCG/ACT inhaler Inhale 2 puffs into the lungs every 4 (four) hours as needed for wheezing or shortness of breath.   Marland Kitchen albuterol (PROVENTIL) (2.5 MG/3ML) 0.083% nebulizer solution Take 3 mLs (2.5 mg total) by nebulization every 4 (four) hours as needed for wheezing or shortness of breath.  . Aspirin 81 MG CAPS Take 1 tablet by mouth daily.  . cetirizine (ZYRTEC) 10 MG tablet Take 10 mg by mouth at bedtime.  . cholecalciferol (VITAMIN D) 1000 units tablet Take 1,000 Units by mouth at bedtime.  . fluticasone (FLONASE) 50 MCG/ACT nasal spray Place 2 sprays into both nostrils daily.  . montelukast (SINGULAIR) 10 MG tablet Take 10 mg by mouth at  bedtime.  . naproxen (NAPROSYN) 500 MG tablet Take 500 mg by mouth 2 (two) times daily as needed.  . polyethylene glycol powder (GLYCOLAX/MIRALAX) powder Take 17 g by mouth daily as needed for constipation.   Immunization History  Administered Date(s) Administered  . Influenza-Unspecified 05/22/2010, 07/09/2011, 06/13/2013, 08/02/2014, 05/17/2015, 10/16/2016, 06/10/2017, 07/14/2018, 06/28/2019, 05/07/2020, 05/19/2022  . Tdap 03/22/2020       Objective:   Physical Exam BP 120/78 (BP Location: Left Arm, Cuff Size: Large)   Pulse 78   Temp 97.8 F (36.6 C)   Ht 5\' 2"  (1.575 m)   Wt 226 lb 12.8 oz (102.9 kg)   SpO2 95%   BMI 41.48 kg/m   SpO2: 95 % O2 Device: None (Room air)  GENERAL: Obese woman, no acute distress, fully ambulatory, no conversational dyspnea. HEAD: Normocephalic, atraumatic.  EYES: Pupils equal, round, reactive to light.  No scleral icterus.  MOUTH: Dentition intact, oral mucosa moist.  No thrush. NECK: Supple. No thyromegaly. Trachea midline. No JVD.  No adenopathy. PULMONARY: Good air entry bilaterally.  No adventitious sounds. CARDIOVASCULAR: S1 and S2. Regular rate and rhythm.  No rubs, murmurs or gallops heard.   ABDOMEN: Significant truncal obesity.  Otherwise benign. MUSCULOSKELETAL: No joint deformity, no clubbing, no edema.  NEUROLOGIC: No overt focal deficit, no gait disturbance, speech is fluent. SKIN: Intact,warm,dry. PSYCH:  Mood and behavior normal  [place]       Assessment & Plan:      Orders Placed This Encounter  Procedures  . T4, free    Standing Status:   Future    Standing Expiration Date:   12/29/2023  . TSH    Standing Status:   Future    Standing Expiration Date:   12/29/2023  . AMB REFERRAL FOR DME    Referral Priority:   Routine    Referral Type:   Durable Medical Equipment Purchase    Number of Visits Requested:   1  . Nitric oxide  . Home sleep test    Standing Status:   Future    Standing Expiration Date:    12/29/2023    Order Specific Question:   Where should this test be performed:    Answer:   [facility]   Meds ordered this encounter  Medications  . Fluticasone-Umeclidin-Vilant (TRELEGY ELLIPTA) 100-62.5-25 MCG/ACT AEPB    Sig: Inhale 1 puff into the lungs daily.    Dispense:  14 each    Refill:  0    Order Specific Question:   Lot Number?    Answer:   [id number]    Order Specific Question:   Expiration Date?    Answer:   03/11/2024    Order Specific Question:   Quantity    Answer:   1  . albuterol (VENTOLIN HFA) 108 (90 Zhang) MCG/ACT inhaler    Sig: Inhale 2 puffs into the lungs every 6 (six) hours as needed for wheezing or shortness of breath.    Dispense:  8 g    Refill:  2    Patient prefers Ventolin brand

## 2022-12-30 ENCOUNTER — Encounter: Payer: Self-pay | Admitting: Pulmonary Disease

## 2023-01-12 ENCOUNTER — Telehealth: Payer: Self-pay | Admitting: Pulmonary Disease

## 2023-01-12 MED ORDER — TRELEGY ELLIPTA 100-62.5-25 MCG/ACT IN AEPB: 1.0000 | INHALATION_SPRAY | Freq: Every day | RESPIRATORY_TRACT | 5 refills | Status: DC

## 2023-01-12 NOTE — Telephone Encounter (Signed)
I have sent in the prescription and notified the patient.  Nothing further needed. 

## 2023-01-20 ENCOUNTER — Telehealth: Payer: Self-pay | Admitting: Pulmonary Disease

## 2023-01-20 NOTE — Telephone Encounter (Signed)
I spoke with the patient. She was needing the number to Snap to get her home sleep study done.   I gave her the number. Nothing further needed.

## 2023-01-20 NOTE — Telephone Encounter (Signed)
Patient called to ask the the nurse call her back regarding her sleep test.  She stated she missed a call and would like someone to call her back with the information.  CB# [phone]

## 2023-01-27 ENCOUNTER — Ambulatory Visit
Admission: RE | Admit: 2023-01-27 | Discharge: 2023-01-27 | Disposition: A | Discharge status: Home / Self Care | Payer: 59 | Source: Ambulatory Visit | Attending: Otolaryngology | Admitting: Otolaryngology

## 2023-01-27 DIAGNOSIS — E041 Nontoxic single thyroid nodule: Secondary | ICD-10-CM

## 2023-02-18 ENCOUNTER — Telehealth: Payer: Self-pay | Admitting: Pulmonary Disease

## 2023-02-18 ENCOUNTER — Encounter (INDEPENDENT_AMBULATORY_CARE_PROVIDER_SITE_OTHER): Payer: 59

## 2023-02-18 DIAGNOSIS — G4733 Obstructive sleep apnea (adult) (pediatric): Secondary | ICD-10-CM | POA: Diagnosis not present

## 2023-02-18 DIAGNOSIS — Z9189 Other specified personal risk factors, not elsewhere classified: Secondary | ICD-10-CM

## 2023-02-18 NOTE — Telephone Encounter (Signed)
Spoke to patient and relayed below results/recommendations. She would like to call back tomorrow with update after she gives this some thought. She has a lot going on currently.

## 2023-02-18 NOTE — Telephone Encounter (Signed)
Sleep study performed on 25 June, results are as follows: Mild sleep apnea with moderate oxygen desaturations.  Since she does have significant daytime sleepiness auto CPAP with be reasonable with a pressure of 5 to 15 cm H2O.  Heated humidity and mask of choice.  Other possibilities include watchful waiting with emphasis on weight loss.  Trial of oral appliance.  Given that she is very symptomatic I would recommend the trial of CPAP.

## 2023-02-18 NOTE — Telephone Encounter (Signed)
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  CPAP therapy if there is significant daytime sleepiness or other comorbidities including history of CVA or cardiac disease  -If CPAP is chosen as an option of treatment auto titrating CPAP with a pressure setting of 5-15 will be appropriate  2.  Watchful waiting with emphasis on weight loss measures, sleep position modification to optimize lateral sleep, elevating the head of the bed by about 30 degrees may also help.  3.  An oral device may be fashioned for the treatment of mild sleep disordered breathing, will involve referral to dentist.   Follow-up as previously scheduled

## 2023-02-19 NOTE — Telephone Encounter (Signed)
I spoke with the patient. She would like to try the CPAP machine. I have placed the order.   Nothing further needed.

## 2023-02-24 ENCOUNTER — Other Ambulatory Visit: Payer: Self-pay | Admitting: Pulmonary Disease

## 2023-03-23 ENCOUNTER — Telehealth: Payer: Self-pay

## 2023-03-23 NOTE — Telephone Encounter (Signed)
Appt scheduled 06/18/2023 at 11:30 Nothing further needed.

## 2023-03-23 NOTE — Telephone Encounter (Signed)
Received cpap set up confirmation from [employer]. Appt needed between 04/23/2023-06/21/2023. Patient currently scheduled to see Dr. Jayme Cloud 04/02/2023. Lm for patient to reschedule appt.

## 2023-04-02 ENCOUNTER — Ambulatory Visit: Payer: 59 | Admitting: Pulmonary Disease

## 2023-05-18 ENCOUNTER — Other Ambulatory Visit: Payer: Self-pay | Admitting: Family Medicine

## 2023-05-18 ENCOUNTER — Telehealth: Payer: Self-pay | Admitting: Pulmonary Disease

## 2023-05-18 DIAGNOSIS — Z1231 Encounter for screening mammogram for malignant neoplasm of breast: Secondary | ICD-10-CM

## 2023-05-18 DIAGNOSIS — G4733 Obstructive sleep apnea (adult) (pediatric): Secondary | ICD-10-CM

## 2023-05-18 NOTE — Telephone Encounter (Signed)
I notified the patient. She said she is not able to go to [facility] to have the study done. She asked that it be done in [facility]. I have ordered the study.  Nothing further needed.

## 2023-05-18 NOTE — Telephone Encounter (Signed)
I spoke with the patient. She said she has tried several times to use the CPAP machine and has tried 2 different masks, but she is unable to use it. She said it takes her breath. She knows that her O2 drops down to 73% and she does not want to not wake up. She asked about doing an in lab sleep study/CPAP Titration to see if they could help her with the CPAP machine. She wants to know what her next option is?

## 2023-05-18 NOTE — Telephone Encounter (Signed)
PT having difficulty w/CPAP. States it sucks out all her breath. She has anxiety as it is and this just agrivates that condition.  One alt was losing weight. She said she'd rather just go on a diet. Pls call to advise. She states she does want to speak to a nurse. Marland Kitchen Her 3 IS [phone]

## 2023-05-18 NOTE — Telephone Encounter (Signed)
Lets do a CPAP titration study at [facility].  Lets try this first.  If she continues to have issues then we can try Mast the sensitization at [facility] as well.  But lets first try the titration study.

## 2023-05-26 ENCOUNTER — Ambulatory Visit
Admission: RE | Admit: 2023-05-26 | Discharge: 2023-05-26 | Disposition: A | Discharge status: Home / Self Care | Payer: 59 | Source: Ambulatory Visit | Attending: Family Medicine | Admitting: Family Medicine

## 2023-05-26 ENCOUNTER — Ambulatory Visit: Payer: 59 | Attending: Otolaryngology

## 2023-05-26 DIAGNOSIS — J45909 Unspecified asthma, uncomplicated: Secondary | ICD-10-CM | POA: Diagnosis not present

## 2023-05-26 DIAGNOSIS — Z1231 Encounter for screening mammogram for malignant neoplasm of breast: Secondary | ICD-10-CM | POA: Diagnosis present

## 2023-05-26 DIAGNOSIS — G4733 Obstructive sleep apnea (adult) (pediatric): Secondary | ICD-10-CM | POA: Diagnosis not present

## 2023-05-26 DIAGNOSIS — R400 Somnolence: Secondary | ICD-10-CM | POA: Insufficient documentation

## 2023-05-26 DIAGNOSIS — Z6841 Body Mass Index (BMI) 40.0 and over, adult: Secondary | ICD-10-CM | POA: Insufficient documentation

## 2023-05-26 DIAGNOSIS — E669 Obesity, unspecified: Secondary | ICD-10-CM | POA: Insufficient documentation

## 2023-06-09 ENCOUNTER — Telehealth (INDEPENDENT_AMBULATORY_CARE_PROVIDER_SITE_OTHER): Payer: 59 | Admitting: Pulmonary Disease

## 2023-06-09 DIAGNOSIS — G4733 Obstructive sleep apnea (adult) (pediatric): Secondary | ICD-10-CM

## 2023-06-09 NOTE — Telephone Encounter (Signed)
CPAP 7 to 8 cm seems to be effective Use small F10 mask

## 2023-06-09 NOTE — Addendum Note (Signed)
Addended by: Bonney Leitz on: 06/09/2023 05:02 PM   Modules accepted: Orders

## 2023-06-09 NOTE — Telephone Encounter (Signed)
Order CPAP as noted.  Will try CPAP of 8 with a small F10 mask.  Heated humidity.

## 2023-06-09 NOTE — Telephone Encounter (Signed)
I do not have the formal tracings of the study yet.  This was by Dr. Reginia Naas interpretation that he communicated to me.

## 2023-06-09 NOTE — Telephone Encounter (Signed)
I have placed the order for the mask and settings to be changed and I have notified the patient. She wants to know if the study showed her sleep apnea with better or worse then when she did the home sleep study?

## 2023-06-10 NOTE — Telephone Encounter (Signed)
I have notified the patient. Nothing further needed. 

## 2023-06-15 ENCOUNTER — Telehealth: Payer: Self-pay | Admitting: Pulmonary Disease

## 2023-06-15 NOTE — Telephone Encounter (Signed)
PT would like to speak to Dr. Timoteo Expose nurse about a few CPAP issues. Please call @ [phone]

## 2023-06-15 NOTE — Telephone Encounter (Signed)
I spoke with the patient. She said her car is broke down and she will have to reschedule her appt that was schedule on Thursday(11/7).   Dr. Jayme Cloud- She was scheduled to have a compliance check on Thursday, but she has not been wearing the CPAP because she had to have a CPAP titration study done. We just placed an order to have her setting changed and a for a new mask. How soon does she need to be seen?

## 2023-06-16 NOTE — Telephone Encounter (Signed)
I have notified the patient. She will call back once she has received her new mask and reschedule her appt for 31-90 days after.   Nothing further needed.

## 2023-06-16 NOTE — Telephone Encounter (Signed)
Once she gets her equipment she can be scheduled for 90 days post that.  She can be scheduled with NP if needed be.

## 2023-06-18 ENCOUNTER — Ambulatory Visit: Payer: 59 | Admitting: Pulmonary Disease

## 2023-06-18 ENCOUNTER — Other Ambulatory Visit: Payer: Self-pay | Admitting: Pulmonary Disease

## 2023-06-23 ENCOUNTER — Other Ambulatory Visit: Payer: Self-pay | Admitting: Pulmonary Disease

## 2023-07-14 ENCOUNTER — Telehealth: Payer: Self-pay | Admitting: Pulmonary Disease

## 2023-07-14 NOTE — Telephone Encounter (Signed)
Sent community message to [facility] to follow up on order placed on 06/09/2023.

## 2023-07-14 NOTE — Telephone Encounter (Signed)
Pt states DME company has stated they do not have an updated rx with new cpap settings. Pt would like a call when rx is sent and when settings have been updated.

## 2023-07-14 NOTE — Telephone Encounter (Signed)
I also sent urgent message to [employer] about this issue

## 2023-07-14 NOTE — Telephone Encounter (Signed)
Patient advised that [employer] has DME order. And that she should be getting a call from [employer]. Patient requested phone number. Provided phone number to [facility] [phone]. Advised patient to call us back with any other questions or concerns. Nothing further to do.

## 2023-07-14 NOTE — Telephone Encounter (Signed)
We received a message from Angelica Zhang with [employer] This order was recieved 06/12/23 949am and was sent to our resupply team on 06/23/23 912am and recieved.   The order was processed and sent out on 06/24/23 but returned on 07/02/23 by the patient.  I will see if someone from re-supply can contact her.

## 2023-08-07 ENCOUNTER — Ambulatory Visit: Payer: 59 | Admitting: Podiatry

## 2023-08-19 ENCOUNTER — Ambulatory Visit: Payer: 59 | Admitting: Pulmonary Disease

## 2023-09-04 ENCOUNTER — Ambulatory Visit (INDEPENDENT_AMBULATORY_CARE_PROVIDER_SITE_OTHER): Payer: 59 | Admitting: Podiatry

## 2023-09-04 ENCOUNTER — Encounter: Payer: Self-pay | Admitting: Podiatry

## 2023-09-04 DIAGNOSIS — M7752 Other enthesopathy of left foot: Secondary | ICD-10-CM | POA: Diagnosis not present

## 2023-09-04 DIAGNOSIS — G5762 Lesion of plantar nerve, left lower limb: Secondary | ICD-10-CM | POA: Diagnosis not present

## 2023-09-04 DIAGNOSIS — G5792 Unspecified mononeuropathy of left lower limb: Secondary | ICD-10-CM

## 2023-09-04 MED ORDER — BETAMETHASONE SOD PHOS & ACET 6 (3-3) MG/ML IJ SUSP
3.0000 mg | Freq: Once | INTRAMUSCULAR | Status: AC
  Administered 2023-09-04: 3 mg via INTRA_ARTICULAR

## 2023-09-04 NOTE — Progress Notes (Signed)
Chief Complaint  Patient presents with   Nail Problem    "My pinkie toenail on my left foot is hurting." N - pinkie toe hurts L - 5th digit left D - 3 mos O - suddenly C - burns, sharp pain at times, the nail fell off A - none T - keep it clean    HPI: 68 y.o. female presenting today for evaluation of burning sensation associated to the left fifth digit ongoing for about 3 months now.  Patient states that about 3 months ago she had constant burning and she noticed that the toenail actually fell off.  Since that time the burning sensation has become less frequent and it occasionally burns approximately once per week.  Denies a history of injury.  Idiopathic.  Past Medical History:  Diagnosis Date   Acute bronchitis    Acute upper respiratory infections of unspecified site    Anxiety    Asthma    Backache, unspecified    Bradycardia    Chronic airway obstruction, not elsewhere classified    Depressive disorder, not elsewhere classified    Hypotension, unspecified    Osteoarthrosis, unspecified whether generalized or localized, other specified sites    Other and unspecified hyperlipidemia    Screening for diabetes mellitus    Sinusitis    Umbilical hernia without mention of obstruction or gangrene    Unspecified hypertrophic and atrophic condition of skin    Unspecified local infection of skin and subcutaneous tissue    Urinary calculus, unspecified     Past Surgical History:  Procedure Laterality Date   BACK SURGERY     BREAST BIOPSY Left 01/07/2017   Affirm Bx- neg   CARDIAC CATHETERIZATION  09/20/2009   [facility];[place]   CHOLECYSTECTOMY     COLONOSCOPY WITH PROPOFOL N/A 04/23/2017   Procedure: COLONOSCOPY WITH PROPOFOL;  Surgeon: Christena Deem, MD;  Location: [facility] ENDOSCOPY;  Service: Endoscopy;  Laterality: N/A;   COLONOSCOPY WITH PROPOFOL N/A 06/10/2021   Procedure: COLONOSCOPY WITH PROPOFOL;  Surgeon: Regis Bill, MD;  Location: [facility] ENDOSCOPY;  Service:  Endoscopy;  Laterality: N/A;   HERNIA REPAIR     x 2   PACEMAKER INSERTION N/A 09/03/2016   Procedure: INSERTION PACEMAKER;  Surgeon: Marcina Millard, MD;  Location: [facility] ORS;  Service: Cardiovascular;  Laterality: N/A;   ROTATOR CUFF REPAIR      Allergies  Allergen Reactions   Meloxicam Nausea And Vomiting and Nausea Only   Linaclotide     Other reaction(s): Other (See Comments) Flushing     Physical Exam: General: The patient is alert and oriented x3 in no acute distress.  Dermatology: Skin is warm, dry and supple bilateral lower extremities.   Vascular: Palpable pedal pulses bilaterally. Capillary refill within normal limits.  No appreciable edema.  No erythema.  Neurological: Grossly intact via light touch  Musculoskeletal Exam: No pedal deformities noted  Assessment/Plan of Care: 1.  Neuritis/capsulitis left fifth toe  -Patient evaluated -I explained to the patient that I do not believe that she is experiencing ingrown toenails.  She was concerned for ingrown toenail -Injection of 0.5 cc Celestone Soluspan injected on the fifth digit left foot -Recommend the patient pays close attention to the shoes that she wears.  I do believe that there may be 1 shoe that is eliciting her symptoms and pain -Return to clinic as needed       Felecia Shelling, DPM [facility]  Dr. Felecia Shelling, DPM  [address] [place], [place] [address]                Office [phone]  Fax [phone]

## 2023-09-16 ENCOUNTER — Telehealth: Payer: Self-pay | Admitting: Pulmonary Disease

## 2023-09-16 MED ORDER — FLUTICASONE-SALMETEROL 230-21 MCG/ACT IN AERO: 2.0000 | INHALATION_SPRAY | Freq: Two times a day (BID) | RESPIRATORY_TRACT | 3 refills | Status: DC

## 2023-09-16 MED ORDER — FLUCONAZOLE 100 MG PO TABS: 100.0000 mg | ORAL_TABLET | Freq: Every day | ORAL | 0 refills | Status: AC

## 2023-09-16 NOTE — Telephone Encounter (Signed)
 I sent prescriptions for Advair HFA and fluconazole  to her pharmacy.  She needs to rinse well after inhaler use.  She can put some baking soda in the water this will help with preventing thrush.  She was last seen here on May 2024 and advised to see us  in 2 to 3 months time and then not follow up.  I have given her only 3 refills on the Advair HFA she needs to make an appointment for follow-up.

## 2023-09-16 NOTE — Telephone Encounter (Signed)
 Patient states Trelegy not working. Having symptoms of thrush and cough. States Advair Aerosole works better. Pharmacy is [facility] [address]. Patient phone number is [phone].

## 2023-09-16 NOTE — Telephone Encounter (Signed)
 Patient advised as below. Nothing further needed.

## 2023-09-17 ENCOUNTER — Other Ambulatory Visit: Payer: Self-pay | Admitting: Pulmonary Disease

## 2023-09-18 MED ORDER — MOMETASONE FURO-FORMOTEROL FUM 200-5 MCG/ACT IN AERO: 2.0000 | INHALATION_SPRAY | Freq: Two times a day (BID) | RESPIRATORY_TRACT | 6 refills | Status: DC

## 2023-09-18 NOTE — Addendum Note (Signed)
 Addended by: Marc Senior on: 09/18/2023 05:08 PM   Modules accepted: Orders

## 2023-09-22 ENCOUNTER — Telehealth: Payer: Self-pay | Admitting: Pulmonary Disease

## 2023-09-22 MED ORDER — FLUTICASONE-SALMETEROL 230-21 MCG/ACT IN AERO: 2.0000 | INHALATION_SPRAY | Freq: Two times a day (BID) | RESPIRATORY_TRACT | 12 refills | Status: DC

## 2023-09-22 NOTE — Telephone Encounter (Signed)
Patient states Advair not called into pharmacy. Needs the aerosole kind. Pharmacy is [facility] [address] States [place] make her heart race. Patient phone number is [phone].

## 2023-09-22 NOTE — Telephone Encounter (Signed)
Patient states Advair needs prior authorization. Fax number is [phone]. Patient phone number is [phone].

## 2023-09-22 NOTE — Telephone Encounter (Signed)
[facility] Patient was advised that medication was sent on 09/16/23, and it was not approved by insurance. Patient request to re-send. Nothing further needed.

## 2023-09-24 ENCOUNTER — Other Ambulatory Visit (HOSPITAL_COMMUNITY): Payer: Self-pay

## 2023-09-24 NOTE — Telephone Encounter (Signed)
I notified the patient. She said she wants the Advair. She said her PCP called her insurance before about another medication and told them she needed it and they approved it. She wants to know if we can reach out to her insurance and tell them she needs the Advair,.  Pharmacy team please advise.

## 2023-09-24 NOTE — Telephone Encounter (Signed)
We can send prescription for Symbicort 160/4.5, 2 inhalations twice a day.

## 2023-09-24 NOTE — Telephone Encounter (Signed)
Per insurance preferred alternatives are: SYMBICORT;BREO;FLUTICASONE/SALMETEROL (ADVAIR DISKUS);DULERA

## 2023-09-24 NOTE — Telephone Encounter (Signed)
Symbicort is similar to the Advair HFA and actually gentler on the throat than the Advair.  If she still wants [place] to do a preauth we can try.

## 2023-09-25 MED ORDER — BUDESONIDE-FORMOTEROL FUMARATE 160-4.5 MCG/ACT IN AERO: 2.0000 | INHALATION_SPRAY | Freq: Two times a day (BID) | RESPIRATORY_TRACT | 12 refills | Status: DC

## 2023-09-25 NOTE — Telephone Encounter (Signed)
Pt states she spoke with insurance they wont cover the old medicine but they will cover symbicort. Needs aerosal can do the powder inhalers. Is asking for a call back

## 2023-09-25 NOTE — Telephone Encounter (Signed)
If the patient has had a trial and failure of any of the preferred alternatives- we can try an auth for the Advair. If not there is not much chance of getting it covered.

## 2023-09-25 NOTE — Telephone Encounter (Signed)
I have sent in the prescription and notified the patient.  Nothing further needed.

## 2023-09-30 ENCOUNTER — Other Ambulatory Visit: Payer: Self-pay | Admitting: Pulmonary Disease

## 2023-10-22 ENCOUNTER — Encounter: Payer: Self-pay | Admitting: Pulmonary Disease

## 2023-10-22 ENCOUNTER — Ambulatory Visit (INDEPENDENT_AMBULATORY_CARE_PROVIDER_SITE_OTHER): Payer: 59 | Admitting: Pulmonary Disease

## 2023-10-22 VITALS — BP 120/78 | HR 68 | Temp 97.1°F | Ht 62.0 in | Wt 213.6 lb

## 2023-10-22 DIAGNOSIS — G4733 Obstructive sleep apnea (adult) (pediatric): Secondary | ICD-10-CM

## 2023-10-22 DIAGNOSIS — R0602 Shortness of breath: Secondary | ICD-10-CM

## 2023-10-22 DIAGNOSIS — J4530 Mild persistent asthma, uncomplicated: Secondary | ICD-10-CM

## 2023-10-22 DIAGNOSIS — Z87891 Personal history of nicotine dependence: Secondary | ICD-10-CM

## 2023-10-22 DIAGNOSIS — Z6839 Body mass index (BMI) 39.0-39.9, adult: Secondary | ICD-10-CM

## 2023-10-22 MED ORDER — FLUTICASONE-SALMETEROL 115-21 MCG/ACT IN AERO: 2.0000 | INHALATION_SPRAY | Freq: Two times a day (BID) | RESPIRATORY_TRACT | 12 refills | Status: DC

## 2023-10-22 NOTE — Patient Instructions (Signed)
 VISIT SUMMARY:  During today's visit, we discussed your mild persistent asthma, post-infectious cough, obesity hypoventilation syndrome, and mild obstructive sleep apnea. We reviewed your recent health issues, including your preference for Advair aerosol for asthma management, and addressed your concerns about phlegm and mucus production. We also talked about your weight loss goals and the importance of exercise and diet in managing your conditions.  YOUR PLAN:  -MILD PERSISTENT ASTHMA: Mild persistent asthma is a condition where the airways in your lungs are inflamed and narrowed, causing breathing difficulties. We will prescribe Advair aerosol with a spacer to help manage your symptoms and educate you on proper inhaler technique to prevent thrush.  -POST-INFECTIOUS COUGH: A post-infectious cough is a lingering cough that follows a respiratory infection. We recommend taking Mucinex plain (extra strength) twice daily with plenty of water, using warm fluids and ginger tea to help clear phlegm, and using an air purifier to reduce dust exposure. Avoid using a humidifier to prevent mold issues.  -OBESITY HYPOVENTILATION SYNDROME: Obesity hypoventilation syndrome is a breathing disorder that affects some people with obesity, leading to low oxygen levels and high carbon dioxide levels in the blood. We encourage you to lose weight through diet and exercise, which may improve your symptoms. If symptoms persist, we may refer you to a sleep specialist.  -MILD OBSTRUCTIVE SLEEP APNEA: Mild obstructive sleep apnea is a condition where the airway becomes blocked during sleep, causing breathing interruptions. Weight loss is recommended as the primary intervention. If symptoms persist, we may consider a repeat sleep study and refer you to a sleep specialist for further evaluation.  INSTRUCTIONS:  Please schedule a follow-up appointment in 6-8 weeks to monitor your respiratory conditions and response to treatment.

## 2023-10-22 NOTE — Progress Notes (Signed)
 Subjective:    Patient ID: Angelica Zhang, female    DOB: 1956/07/29, 68 y.o.   MRN: [id number]  Patient Care Team: [name], MD as PCP - General (Family Medicine) [name], MD as Consulting Physician (Pulmonary Disease)  Chief Complaint  Patient presents with   Follow-up    Had pneumonia around Christmas. Increased DOE. Some wheezing. Cough with thick white sputum.     BACKGROUND/INTERVAL: Patient is a 68 year old former smoker with a 40-pack-year history of smoking and a history as noted below who presents for follow-up on the issue of dyspnea on exertion wheezing and cough.  She has mild persistent asthma, morbid obesity and obstructive sleep apnea.  HPI Discussed the use of AI scribe software for clinical note transcription with the patient, who gave verbal consent to proceed.  History of Present Illness   The patient, with mild persistent asthma and obesity hypoventilation syndrome, presents with shortness of breath and issues related to obesity. She lives with her 7 year old daughter.  She has mild persistent asthma and has been experiencing exacerbations. She was previously on Advair aerosol, which she found effective, but insurance issues led to a switch to Symbicort. She prefers to return to Advair aerosol due to better personal outcomes and confirms having a spacer for the inhaler.  She became ill right before Christmas, and the illness persisted, leading to frequent visits to the [facility] walk-in clinic, especially on weekends. About two weeks ago, she was prescribed a Z-Pak and another antibiotic, along with another round of prednisone. She has since stopped all medications, including those for thrush, as of last Saturday. She is currently trying to clear her system and avoid getting sick again.  She experienced thrush, which she attributes to the use of antibiotics and inhalers. The thrush was severe, causing thick, peeling patches in her mouth and lips.  Although the thrush has resolved, it left her with a leathery feeling on the roof of her mouth. She lost about ten pounds during this period due to her inability to eat and feeling sick. No pain in her mouth despite the leathery feeling left by the resolved thrush.  She attributes to thrush to the Symbicort inhaler however it was made clear to her that she had been on several rounds of antibiotics and prednisone which likely also aggravated this issue and had more to do with the thrush.  She is experiencing issues with phlegm and mucus, which she describes as a new problem. She drinks 64 ounces of water daily and is considering using an air purifier to help with dust in her apartment. She lives with her 30 year old daughter, and she has issues with dust accumulation in her apartment.  She wants to start exercising again, mentioning that she enjoys walking and dancing. She has been doing seated exercises due to her current condition. She acknowledges anxiety and is willing to lose weight to improve her health.  No significant issues with sleep, although she was previously diagnosed with mild sleep apnea.   She has not been able to wear her CPAP effectively and has only used it for 5 days over the last month.  She attributes this to being sick.    Review of Systems A 10 point review of systems was performed and it is as noted above otherwise negative.   Patient Active Problem List   Diagnosis Date Noted   Sick sinus syndrome (HCC) 09/03/2016   Bradycardia 09/20/2012   Dizziness 09/20/2012   Morbid obesity (  HCC) 09/20/2012   Smoking history 09/20/2012    Social History   Tobacco Use   Smoking status: Former    Current packs/day: 0.00    Average packs/day: 1 pack/day for 40.0 years (40.0 ttl pk-yrs)    Types: Cigarettes, E-cigarettes    Start date: 1976    Quit date: 2016    Years since quitting: 9.2   Smokeless tobacco: Never  Substance Use Topics   Alcohol use: No    Allergies   Allergen Reactions   Meloxicam Nausea And Vomiting and Nausea Only   Doxycycline Other (See Comments)    Hair loss, Made her feel bad   Linaclotide Other (See Comments)    Other reaction(s): Other (See Comments)  Flushing    Current Meds  Medication Sig   albuterol (PROVENTIL) (2.5 MG/3ML) 0.083% nebulizer solution Take 3 mLs (2.5 mg total) by nebulization every 4 (four) hours as needed for wheezing or shortness of breath.   albuterol (VENTOLIN HFA) 108 (90 Zhang) MCG/ACT inhaler TAKE 2 PUFFS BY MOUTH EVERY 6 HOURS AS NEEDED FOR WHEEZE OR SHORTNESS OF BREATH   Aspirin 81 MG CAPS Take 1 tablet by mouth daily.   cetirizine (ZYRTEC) 10 MG tablet Take 10 mg by mouth at bedtime.   cholecalciferol (VITAMIN D) 1000 units tablet Take 1,000 Units by mouth at bedtime.   fluticasone (FLONASE) 50 MCG/ACT nasal spray Place 2 sprays into both nostrils daily.   mometasone-formoterol (DULERA) 100-5 MCG/ACT AERO Inhale 2 puffs into the lungs 2 (two) times daily.   montelukast (SINGULAIR) 10 MG tablet Take 10 mg by mouth at bedtime.   polyethylene glycol powder (GLYCOLAX/MIRALAX) powder Take 17 g by mouth daily as needed for constipation.   [DISCONTINUED] budesonide-formoterol (SYMBICORT) 160-4.5 MCG/ACT inhaler Inhale 2 puffs into the lungs in the morning and at bedtime.   [DISCONTINUED] fluticasone-salmeterol (ADVAIR HFA) 115-21 MCG/ACT inhaler Inhale 2 puffs into the lungs 2 (two) times daily.    Immunization History  Administered Date(s) Administered   Influenza-Unspecified 05/22/2010, 07/09/2011, 06/13/2013, 08/02/2014, 05/17/2015, 10/16/2016, 06/10/2017, 07/14/2018, 06/28/2019, 05/07/2020, 05/19/2022   Tdap 03/22/2020, 01/31/2023        Objective:     BP 120/78 (BP Location: Right Arm, Cuff Size: Large)   Pulse 68   Temp (!) 97.1 F (36.2 C)   Ht 5\' 2"  (1.575 m)   Wt 213 lb 9.6 oz (96.9 kg)   SpO2 96%   BMI 39.07 kg/m   SpO2: 96 % O2 Device: None (Room air)  GENERAL: Obese  woman, no acute distress, fully ambulatory, no conversational dyspnea. HEAD: Normocephalic, atraumatic.  EYES: Pupils equal, round, reactive to light.  No scleral icterus.  MOUTH: Dentition intact, oral mucosa moist.  No thrush. NECK: Supple. No thyromegaly. Trachea midline. No JVD.  No adenopathy. PULMONARY: Good air entry bilaterally.  No adventitious sounds. CARDIOVASCULAR: S1 and S2. Regular rate and rhythm.  No rubs, murmurs or gallops heard.   ABDOMEN: Significant truncal obesity.  Otherwise benign. MUSCULOSKELETAL: No joint deformity, no clubbing, no edema.  NEUROLOGIC: No overt focal deficit, no gait disturbance, speech is fluent. SKIN: Intact,warm,dry. PSYCH: Mood and behavior normal.    Assessment & Plan:     ICD-10-CM   1. Mild persistent asthma without complication  J45.30     2. SOB (shortness of breath)  R06.02     3. Morbid obesity (HCC)  E66.01     4. OSA (obstructive sleep apnea)  G47.33      Meds ordered this encounter  Medications   DISCONTD: fluticasone-salmeterol (ADVAIR HFA) 115-21 MCG/ACT inhaler    Sig: Inhale 2 puffs into the lungs 2 (two) times daily.    Dispense:  1 each    Refill:  12   mometasone-formoterol (DULERA) 100-5 MCG/ACT AERO    Sig: Inhale 2 puffs into the lungs 2 (two) times daily.    Dispense:  13 g    Refill:  11   Discussion:    Mild persistent asthma Mild persistent asthma exacerbated by respiratory infections, complicated by thrush likely due to antibiotic and steroid use and inhaler use without a spacer. Improvement noted after discontinuing medications. She is interested in resuming Advair aerosol, which she has previously tolerated well.  However, her insurance does not cover Advair aerosol. - Prescribe Advair aerosol with a spacer, patient was advised that this may not be covered by insurance - Educate on proper inhaler technique to prevent thrush  Post-infectious cough Post-infectious cough with phlegm production  following a recent respiratory infection, expected to persist for a few more weeks. - Recommend Mucinex plain (extra strength) twice daily with plenty of water - Advise on using warm fluids and ginger tea to help clear phlegm - Advise against using a humidifier due to potential mold issues - Encourage use of an air purifier to reduce dust exposure  Obesity hypoventilation syndrome Obesity hypoventilation syndrome contributing to respiratory issues. She is motivated to lose weight, which may improve symptoms and reduce the need for further interventions. - Encourage weight loss through diet and exercise  Mild obstructive sleep apnea Mild obstructive sleep apnea with weight loss recommended as the primary intervention. Further evaluation may be needed if symptoms persist. - Encourage weight loss as a primary intervention - Consider repeat sleep study if symptoms persist - Inform about availability of a sleep specialist for further evaluation  Follow-up Follow-up required to monitor respiratory conditions and response to treatment. - Schedule follow-up appointment in 6-8 weeks     Advised if symptoms do not improve or worsen, to please contact office for sooner follow up or seek emergency care.    I spent 30 minutes of dedicated to the care of this patient on the date of this encounter to include pre-visit review of records, face-to-face time with the patient discussing conditions above, post visit ordering of testing, clinical documentation with the electronic health record, making appropriate referrals as documented, and communicating necessary findings to members of the patients care team.     C. Danice Goltz, MD Advanced Bronchoscopy PCCM [facility]    *This note was generated using voice recognition software/Dragon and/or AI transcription program.  Despite best efforts to proofread, errors can occur which can change the meaning. Any transcriptional errors that  result from this process are unintentional and may not be fully corrected at the time of dictation.

## 2023-10-23 ENCOUNTER — Telehealth: Payer: Self-pay | Admitting: Pulmonary Disease

## 2023-10-23 ENCOUNTER — Other Ambulatory Visit (HOSPITAL_COMMUNITY): Payer: Self-pay

## 2023-10-23 MED ORDER — MOMETASONE FURO-FORMOTEROL FUM 100-5 MCG/ACT IN AERO: 2.0000 | INHALATION_SPRAY | Freq: Two times a day (BID) | RESPIRATORY_TRACT | 11 refills | Status: DC

## 2023-10-23 NOTE — Telephone Encounter (Signed)
 Advair HFA 115-21 MCG/ACT inhaler.

## 2023-10-23 NOTE — Telephone Encounter (Signed)
 Pt states pharmacy is looking for a prior auth for the advair inhaler

## 2023-10-23 NOTE — Telephone Encounter (Signed)
 No problem! What dose is the patient to continue on for the Advair?

## 2023-10-23 NOTE — Telephone Encounter (Signed)
 We received fax from [facility] pharmacy requesting alternative for Advair. Dr. Jayme Cloud sent in Miller to replace Advair.   Patient advised.   Patient tried and failed Dulera and Symbicort. Patient refused to go back on [facility] or Symbicort. She reports she had severe allergic reaction to both medications. She reports severe thrush and yeast infection. Reports that the skin from the top of her mouth was falling off.   Patient is requesting PA to be done for Advair HFA. Reports she used medication for a long time with good symptom control .

## 2023-10-26 ENCOUNTER — Other Ambulatory Visit (HOSPITAL_COMMUNITY): Payer: Self-pay

## 2023-10-26 ENCOUNTER — Telehealth: Payer: Self-pay

## 2023-10-26 NOTE — Telephone Encounter (Signed)
 PA request has been Submitted. New Encounter has been or will be created for follow up. For additional info see Pharmacy Prior Auth telephone encounter from 03/17.

## 2023-10-26 NOTE — Telephone Encounter (Signed)
*  Pulm  Pharmacy Patient Advocate Encounter   Received notification from Pt Calls Messages that prior authorization for Advair HFA 115-21MCG/ACT aerosol  is required/requested.   Insurance verification completed.   The patient is insured through [facility] .   Per test claim: PA required; PA submitted to above mentioned insurance via CoverMyMeds Key/confirmation #/EOC [id number] Status is pending

## 2023-10-27 ENCOUNTER — Telehealth: Payer: Self-pay | Admitting: Pulmonary Disease

## 2023-10-27 NOTE — Telephone Encounter (Signed)
 The patient's insurance continues to deny Advair HFA for the patient.  There have been several preauthorization attempts all have been declined by the patient's insurance company.  We can have her try Advair diskus which is powdered based and that they may substitute with Wixela which is the generic.  If she does not want to try this then you will have to call her insurance company.  We have exhausted preauthorization attempts on our end.

## 2023-10-27 NOTE — Telephone Encounter (Signed)
 Pharmacy Patient Advocate Encounter  Received notification from [facility] that Prior Authorization for Advair HFA 115-21MCG/ACT aerosol  has been DENIED.  Full denial letter will be uploaded to the media tab. See denial reason below.

## 2023-10-27 NOTE — Telephone Encounter (Signed)
 Patient needs an appeal for Advair HFA to be sent to [employer] so that it can be approved for patient to have. [employer] can be reached at [phone]

## 2023-11-02 NOTE — Telephone Encounter (Signed)
 Patient advised as below. She will try [facility]. Nothing further to do.

## 2023-11-02 NOTE — Telephone Encounter (Addendum)
 I spoke with the patient. She said she needs an aerosol inhaler not a powder inhaler. She is currently on Symbicort but does not feel like it is helping her breathing. Is there another aerosol inhaler she can try?

## 2023-11-02 NOTE — Telephone Encounter (Signed)
 The other aerosol inhaler that will be covered by her insurance is [facility] and she indicated that she did not want to get the [facility].  The [facility] is basically the same as Advair HFA which is what she was on previously.

## 2023-11-02 NOTE — Telephone Encounter (Signed)
 Pt is asking for a call back concerning the generic of advair

## 2023-11-09 ENCOUNTER — Encounter: Payer: Self-pay | Admitting: Pulmonary Disease

## 2023-11-23 ENCOUNTER — Other Ambulatory Visit: Payer: Self-pay | Admitting: Family Medicine

## 2023-11-23 DIAGNOSIS — Z1231 Encounter for screening mammogram for malignant neoplasm of breast: Secondary | ICD-10-CM

## 2023-12-18 ENCOUNTER — Encounter: Payer: Self-pay | Admitting: Pulmonary Disease

## 2023-12-18 ENCOUNTER — Ambulatory Visit: Admitting: Pulmonary Disease

## 2023-12-18 VITALS — BP 110/84 | HR 87 | Temp 97.6°F | Ht 62.0 in | Wt 214.6 lb

## 2023-12-18 DIAGNOSIS — Z87891 Personal history of nicotine dependence: Secondary | ICD-10-CM

## 2023-12-18 DIAGNOSIS — J4530 Mild persistent asthma, uncomplicated: Secondary | ICD-10-CM | POA: Diagnosis not present

## 2023-12-18 DIAGNOSIS — R0602 Shortness of breath: Secondary | ICD-10-CM | POA: Diagnosis not present

## 2023-12-18 DIAGNOSIS — J4489 Other specified chronic obstructive pulmonary disease: Secondary | ICD-10-CM

## 2023-12-18 LAB — NITRIC OXIDE: Nitric Oxide: 9

## 2023-12-18 NOTE — Patient Instructions (Signed)
 VISIT SUMMARY:  Today, we discussed your ongoing shortness of breath, especially during exercise, despite your well-managed asthma. We reviewed your recent health history, including your pacemaker status and previous echocardiogram from 2017. We also discussed your regular exercise routine and recent weight loss.  YOUR PLAN:  -DYSPNEA: Dyspnea means difficulty breathing or shortness of breath. Since your lung function is stable, we need to explore other potential causes, such as heart issues. We will order an echocardiogram to check your heart function, perform a walking test to monitor your oxygen levels during exercise, and get a chest X-ray to evaluate your lungs. Additionally, we will set up an annual CT scan for lung cancer screening and refer you to pulmonary rehabilitation to help improve your breathing techniques and exercise tolerance.  -MILD PERSISTENT ASTHMA: Mild persistent asthma is a type of asthma that causes symptoms more than twice a week but not daily. Your asthma appears to be well-managed, so it may not be the main cause of your shortness of breath. Continue using your inhaler as prescribed.  INSTRUCTIONS:  Please schedule the echocardiogram, chest X-ray, and walking test as soon as possible. We will also arrange for your annual CT scan for lung cancer screening and refer you to pulmonary rehabilitation. Follow up with your cardiologist for an in-person consultation.

## 2023-12-18 NOTE — Progress Notes (Signed)
 Subjective:    Patient ID: Angelica Zhang, female    DOB: 1956-01-26, 68 y.o.   MRN: [id number]  Patient Care Team: Lorina Roosevelt, MD as PCP - General (Family Medicine) Marc Senior, MD as Consulting Physician (Pulmonary Disease)  Chief Complaint  Patient presents with   Follow-up    Shortness of breath on exertion. Using symbicort  daily. Patient reports she returned CPAP machine about 2 months ago. Reports she did not tolerate using CPAP.     BACKGROUND/INTERVAL: Patient is a 68 year old former smoker with a 40-pack-year history of smoking and a history as noted below who presents for follow-up on the issue of dyspnea on exertion wheezing and cough. She has mild persistent asthma, morbid obesity and obstructive sleep apnea.   HPI Discussed the use of AI scribe software for clinical note transcription with the patient, who gave verbal consent to proceed.  History of Present Illness   Angelica Zhang is a 68 year old female with mild persistent asthma who presents with ongoing dyspnea.  She experiences shortness of breath, particularly during exercise, which necessitates frequent breaks due to becoming excessively breathless. Despite maintaining a regular exercise routine of at least five days a week and achieving a weight loss of eleven pounds, her dyspnea persists.  Her last echocardiogram was conducted in 2017. She has a pacemaker, which was checked approximately two months ago, with the battery expected to last about three more years. She has not had a recent in-person consultation with her cardiologist, but she did have a phone consultation with her cardiologist's assistant and a battery check two months ago.  No recent changes in lung function.  PFTs in January 2024 showed only minimal obstructive airways disease of asthmatic type and minimal restriction due to obesity.  PFTs are out of proportion to her degree of dyspnea.  She has been diagnosed with obstructive sleep apnea but  states that she did not tolerate CPAP and returned the device to the DME company.    Review of Systems A 10 point review of systems was performed and it is as noted above otherwise negative.   Patient Active Problem List   Diagnosis Date Noted   Sick sinus syndrome (HCC) 09/03/2016   Bradycardia 09/20/2012   Dizziness 09/20/2012   Morbid obesity (HCC) 09/20/2012   Smoking history 09/20/2012    Social History   Tobacco Use   Smoking status: Former    Current packs/day: 0.00    Average packs/day: 1 pack/day for 40.0 years (40.0 ttl pk-yrs)    Types: Cigarettes, E-cigarettes    Start date: 1976    Quit date: 2016    Years since quitting: 9.4   Smokeless tobacco: Never  Substance Use Topics   Alcohol use: No    Allergies  Allergen Reactions   Meloxicam  Nausea And Vomiting and Nausea Only   Doxycycline Other (See Comments)    Hair loss, Made her feel bad   Linaclotide Other (See Comments)    Other reaction(s): Other (See Comments)  Flushing    Current Meds  Medication Sig   albuterol  (PROVENTIL ) (2.5 MG/3ML) 0.083% nebulizer solution Take 3 mLs (2.5 mg total) by nebulization every 4 (four) hours as needed for wheezing or shortness of breath.   albuterol  (VENTOLIN  HFA) 108 (90 Base) MCG/ACT inhaler TAKE 2 PUFFS BY MOUTH EVERY 6 HOURS AS NEEDED FOR WHEEZE OR SHORTNESS OF BREATH   Aspirin 81 MG CAPS Take 1 tablet by mouth daily.   cetirizine (ZYRTEC)  10 MG tablet Take 10 mg by mouth at bedtime.   cholecalciferol (VITAMIN D) 1000 units tablet Take 1,000 Units by mouth at bedtime.   fluticasone  (FLONASE) 50 MCG/ACT nasal spray Place 2 sprays into both nostrils daily.   montelukast  (SINGULAIR ) 10 MG tablet Take 10 mg by mouth at bedtime.   polyethylene glycol powder (GLYCOLAX/MIRALAX) powder Take 17 g by mouth daily as needed for constipation.   SYMBICORT  160-4.5 MCG/ACT inhaler Inhale 2 puffs into the lungs daily in the afternoon.    Immunization History  Administered  Date(s) Administered   Influenza-Unspecified 05/22/2010, 07/09/2011, 06/13/2013, 08/02/2014, 05/17/2015, 10/16/2016, 06/10/2017, 07/14/2018, 06/28/2019, 05/07/2020, 05/19/2022   Tdap 03/22/2020, 01/31/2023        Objective:     BP 110/84 (BP Location: Left Arm, Patient Position: Sitting, Cuff Size: Normal)   Pulse 87   Temp 97.6 F (36.4 C) (Temporal)   Ht 5\' 2"  (1.575 m)   Wt 214 lb 9.6 oz (97.3 kg)   SpO2 97%   BMI 39.25 kg/m   SpO2: 97 %  GENERAL: Obese woman, no acute distress, fully ambulatory, no conversational dyspnea. HEAD: Normocephalic, atraumatic.  EYES: Pupils equal, round, reactive to light.  No scleral icterus.  MOUTH: Dentition intact, oral mucosa moist.  No thrush. NECK: Supple. No thyromegaly. Trachea midline. No JVD.  No adenopathy. PULMONARY: Good air entry bilaterally.  No adventitious sounds. CARDIOVASCULAR: S1 and S2. Regular rate and rhythm.  No rubs, murmurs or gallops heard.   ABDOMEN: Significant truncal obesity.  Otherwise benign. MUSCULOSKELETAL: No joint deformity, no clubbing, no edema.  NEUROLOGIC: No overt focal deficit, no gait disturbance, speech is fluent. SKIN: Intact,warm,dry. PSYCH: Mood and behavior normal.   Lab Results  Component Value Date   NITRICOXIDE 9 12/18/2023  *No evidence of type II inflammation.  Ambulatory oxymetry was performed today:  At rest on room air oxygen saturation was 100%, the patient ambulated at a fast pace, completed 3 laps, O2 nadir 95%, moderate shortness of breath.  Resting heart rate was 65 bpm at maximum for this exercise 117 bpm.  Patient did not have significant oxygen desaturations during exercise.  Transient nadir to 95% which increased to 99% towards end of exercise.  Assessment & Plan:     ICD-10-CM   1. SOB (shortness of breath)  R06.02 Nitric oxide     ECHOCARDIOGRAM COMPLETE    AMB referral to pulmonary rehabilitation    2. Mild persistent asthma without complication  J45.30     3.  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 Former heavy cigarette smoker (20-39 per day)  Z87.891 Ambulatory Referral for Lung Cancer Scre      Orders Placed This Encounter  Procedures   AMB referral to pulmonary rehabilitation    Referral Priority:   Routine    Referral Type:   Consultation    Number of Visits Requested:   1   Ambulatory Referral for Lung Cancer Scre    Referral Priority:   Routine    Referral Type:   Consultation    Referral Reason:   Specialty Services Required    Number of Visits Requested:   1   Nitric oxide    ECHOCARDIOGRAM COMPLETE    Standing Status:   Future    Expiration Date:   12/17/2024    Where should this test be performed:   [facility]    Please indicate who you request to  read the nuc med / echo results.:   [facility]    Perflutren DEFINITY (image enhancing agent) should be administered unless hypersensitivity or allergy exist:   Administer Perflutren    Reason for exam-Echo:   Dyspnea  R06.00   Discussion:    Dyspnea Ongoing dyspnea despite well-managed mild persistent asthma. Dyspnea occurs during exercise, requiring rest. Lung function is not significantly impaired, suggesting other potential causes. Differential diagnosis includes cardiac issues, given the presence of a pacemaker and the time since the last echocardiogram in 2017. Oxygen levels remained stable during exertion, indicating adequate oxygenation. - Order echocardiogram to assess cardiac function - Perform walking test to monitor oxygen levels during exertion - Order chest X-ray to evaluate lung condition - Set up lung cancer screening with annual CT scan - Refer to pulmonary rehabilitation to improve breathing techniques and exercise tolerance  Mild persistent asthma Asthma is well-managed, but dyspnea persists. Inhaler use should be effective given lung function results, indicating asthma may not be the primary cause  of dyspnea.     Advised if symptoms do not improve or worsen, to please contact office for sooner follow up or seek emergency care.    I spent 40 minutes of dedicated to the care of this patient on the date of this encounter to include pre-visit review of records, face-to-face time with the patient discussing conditions above, post visit ordering of testing, clinical documentation with the electronic health record, making appropriate referrals as documented, and communicating necessary findings to members of the patients care team.     C. Chloe Counter, MD Advanced Bronchoscopy PCCM [facility]    *This note was generated using voice recognition software/Dragon and/or AI transcription program.  Despite best efforts to proofread, errors can occur which can change the meaning. Any transcriptional errors that result from this process are unintentional and may not be fully corrected at the time of dictation.

## 2023-12-24 ENCOUNTER — Other Ambulatory Visit: Payer: Self-pay

## 2023-12-24 ENCOUNTER — Telehealth: Payer: Self-pay | Admitting: Acute Care

## 2023-12-24 DIAGNOSIS — Z87891 Personal history of nicotine dependence: Secondary | ICD-10-CM

## 2023-12-24 DIAGNOSIS — Z122 Encounter for screening for malignant neoplasm of respiratory organs: Secondary | ICD-10-CM

## 2023-12-24 NOTE — Telephone Encounter (Signed)
 Lung Cancer Screening Narrative/Criteria Questionnaire (Cigarette Smokers Only- No Cigars/Pipes/vapes)   Angelica Zhang   SDMV:01/15/24 at 1030a/[place]                                           [birth year]              LDCT: 01/19/24 at 1030a/ [facility] [place]    68 y.o.   Phone: [phone]  Lung Screening Narrative (confirm age 48-77 yrs Medicare / 50-80 yrs Private pay insurance)   Insurance information:[employer]   Referring Provider:Gonzalez   This screening involves an initial phone call with a team member from our program. It is called a shared decision making visit. The initial meeting is required by insurance and Medicare to make sure you understand the program. This appointment takes about 15-20 minutes to complete. The CT scan will completed at a separate date/time. This scan takes about 5-10 minutes to complete and you may eat and drink before and after the scan.  Criteria questions for Lung Cancer Screening:   Are you a current or former smoker? Former Age began smoking: 62 y   If you are a former smoker, what year did you quit smoking? NA   To calculate your smoking history, I need an accurate estimate of how many packs of cigarettes you smoked per day and for how many years. (Not just the number of PPD you are now smoking)   Years smoking 42 x Packs per day 1 = Pack years 42   (at least 20 pack yrs)   (Make sure they understand that we need to know how much they have smoked in the past, not just the number of PPD they are smoking now)  Do you have a personal history of cancer?  No    Do you have a family history of cancer? Yes  (cancer type and and relative) sister/lung  Are you coughing up blood?  No  Have you had unexplained weight loss of 15 lbs or more in the last 6 months? No  It looks like you meet all criteria.     Additional information: N/A

## 2024-01-14 ENCOUNTER — Telehealth: Payer: Self-pay

## 2024-01-14 NOTE — Telephone Encounter (Signed)
 Her echocardiogram showed that her heart function was good.  She does not have increased pressure on the artery going from the heart to the lungs which is good news.  She has some minor leakage of some of her valves but none of this is serious.  She does have also some stiffness of the main chamber of the heart, weight loss, staying active and good blood pressure control is key in managing this.

## 2024-01-14 NOTE — Telephone Encounter (Signed)
 Copied from CRM [id number]. Topic: Clinical - Lab/Test Results >> Jan 14, 2024 11:01 AM Margarette Shawl wrote: Reason for CRM:   Pt is contacting clinic to get echocardiogram results, order placed by [facility]. Performed on 01/01/2024 and was performed in [facility](Results avail in Care Everywhere)  Requesting call back to discuss results. Of note, pt has changed cardiologists since procedure.  CB# [phone]

## 2024-01-15 ENCOUNTER — Ambulatory Visit: Admitting: *Deleted

## 2024-01-15 ENCOUNTER — Encounter: Payer: Self-pay | Admitting: *Deleted

## 2024-01-15 DIAGNOSIS — Z87891 Personal history of nicotine dependence: Secondary | ICD-10-CM

## 2024-01-15 NOTE — Patient Instructions (Signed)

## 2024-01-15 NOTE — Progress Notes (Signed)
  Virtual Visit via Telephone Note  I connected with Angelica Zhang on 01/15/24 at 10:30 AM EDT by telephone and verified that I am speaking with the correct person using two identifiers.  Location: Patient: Angelica Zhang Provider: Alyse Bach, RN   I discussed the limitations, risks, security and privacy concerns of performing an evaluation and management service by telephone and the availability of in person appointments. I also discussed with the patient that there may be a patient responsible charge related to this service. The patient expressed understanding and agreed to proceed.   Shared Decision Making Visit Lung Cancer Screening Program [phone])   Eligibility: Age 68 y.o. Pack Years Smoking History Calculation 42 (# packs/per year x # years smoked) Recent History of coughing up blood  no Unexplained weight loss? no ( >Than 15 pounds within the last 6 months ) Prior History Lung / other cancer no (Diagnosis within the last 5 years already requiring surveillance chest CT Scans). Smoking Status Former Smoker Former Smokers: Years since quit: 9 years  Quit Date: 2016  Visit Components: Discussion included one or more decision making aids. yes Discussion included risk/benefits of screening. yes Discussion included potential follow up diagnostic testing for abnormal scans. yes Discussion included meaning and risk of over diagnosis. yes Discussion included meaning and risk of False Positives. yes Discussion included meaning of total radiation exposure. yes  Counseling Included: Importance of adherence to annual lung cancer LDCT screening. yes Impact of comorbidities on ability to participate in the program. yes Ability and willingness to under diagnostic treatment. yes  Smoking Cessation Counseling: Current Smokers:  Discussed importance of smoking cessation. yes Information about tobacco cessation classes and interventions provided to patient. yes Patient provided  with "ticket" for LDCT Scan. no Symptomatic Patient. no  Counseling(Intermediate counseling: > three minutes) 99406 Diagnosis Code: Tobacco Use Z72.0 Asymptomatic Patient yes  Counseling (Intermediate counseling: > three minutes counseling) [id number] Former Smokers:  Discussed the importance of maintaining cigarette abstinence. yes Diagnosis Code: Personal History of Nicotine  Dependence. B14.782 Information about tobacco cessation classes and interventions provided to patient. Yes Patient provided with "ticket" for LDCT Scan. no Written Order for Lung Cancer Screening with LDCT placed in Epic. Yes (CT Chest Lung Cancer Screening Low Dose W/O CM) [id number] Z12.2-Screening of respiratory organs Z87.891-Personal history of nicotine  dependence   Alyse Bach, RN

## 2024-01-19 ENCOUNTER — Ambulatory Visit
Admission: RE | Admit: 2024-01-19 | Discharge: 2024-01-19 | Disposition: A | Discharge status: Home / Self Care | Source: Ambulatory Visit | Attending: Pulmonary Disease | Admitting: Pulmonary Disease

## 2024-01-19 DIAGNOSIS — Z122 Encounter for screening for malignant neoplasm of respiratory organs: Secondary | ICD-10-CM

## 2024-01-19 DIAGNOSIS — Z87891 Personal history of nicotine dependence: Secondary | ICD-10-CM

## 2024-01-24 IMAGING — US US THYROID
1 series · 13 of 25 positions shown · non-contrast
Comparison: 6568, 8288

CLINICAL DATA: Prior ultrasound follow-up. Previous biopsy of right
thyroid nodule September 2020

EXAM:
THYROID ULTRASOUND
TECHNIQUE: Ultrasound examination of the thyroid gland and adjacent soft
tissues was performed.

[Series 1: us thyroid · 0.07mm/px · 13 of 73 slices shown]
[im 1/73]
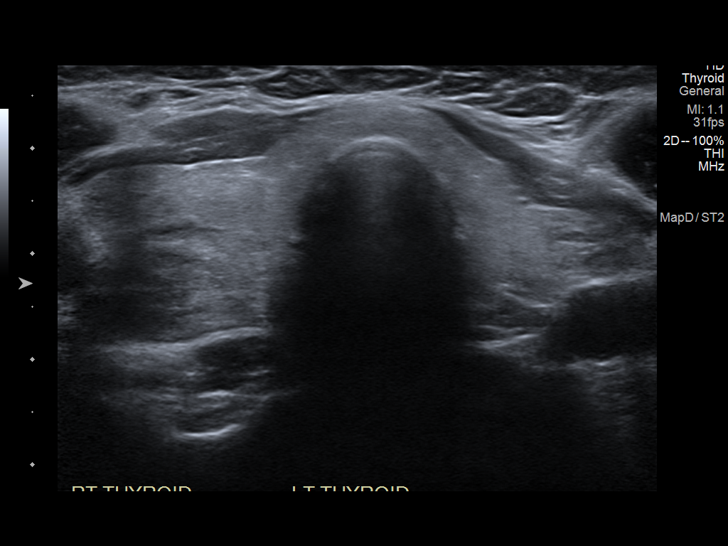
[im 7/73]
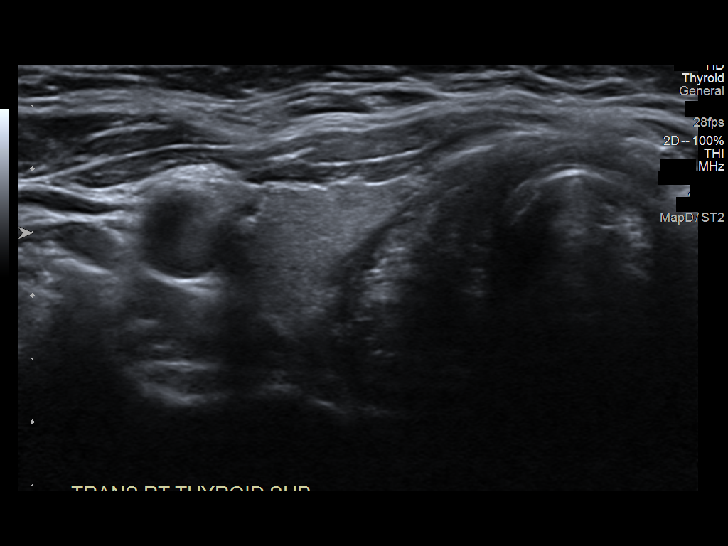
[im 13/73]
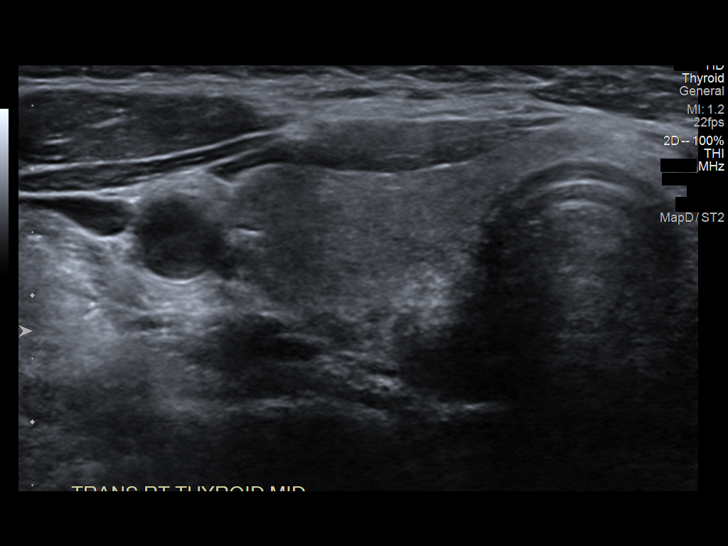
[im 19/73]
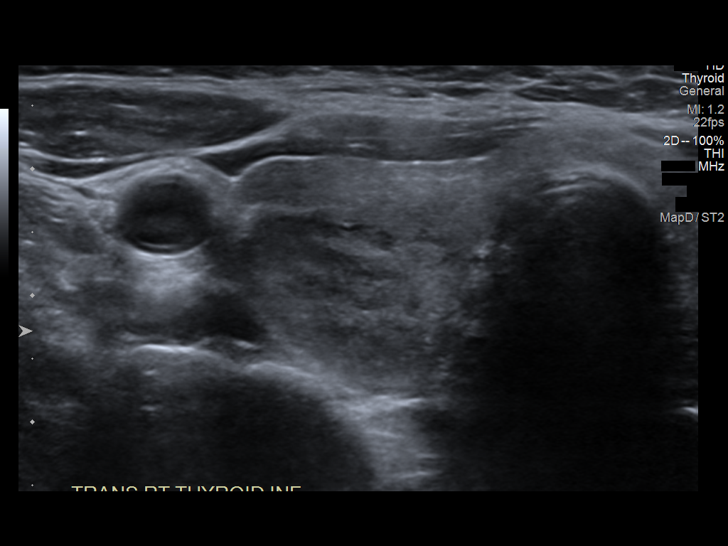
[im 25/73]
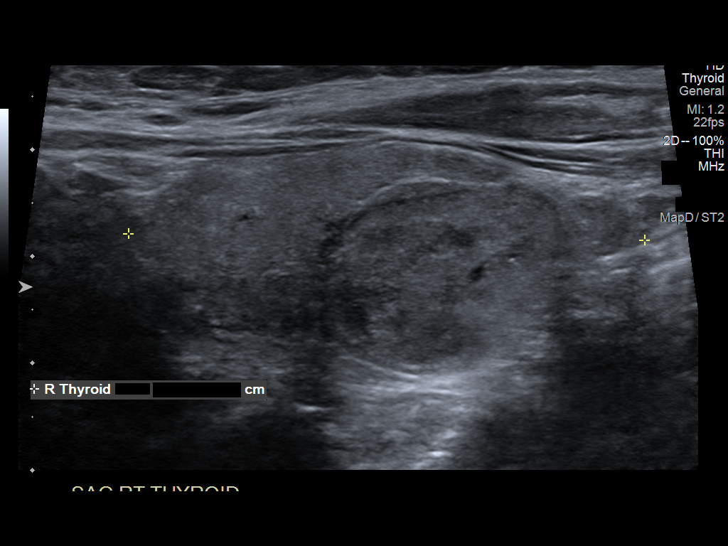
[im 31/73]
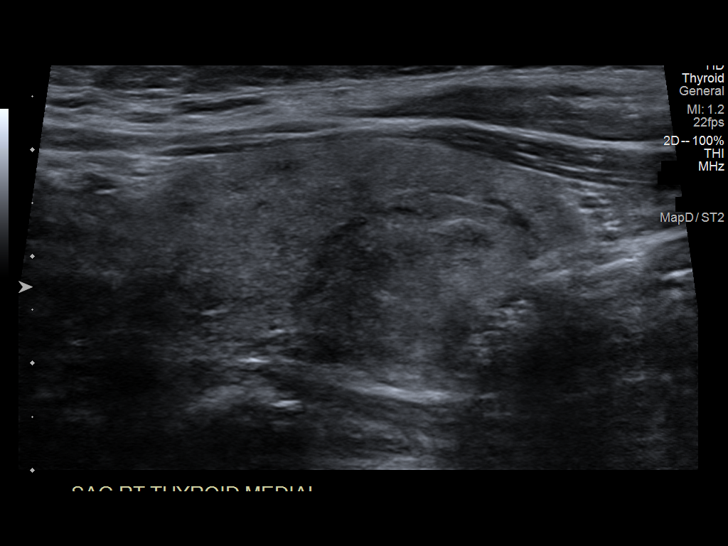
[im 37/73]
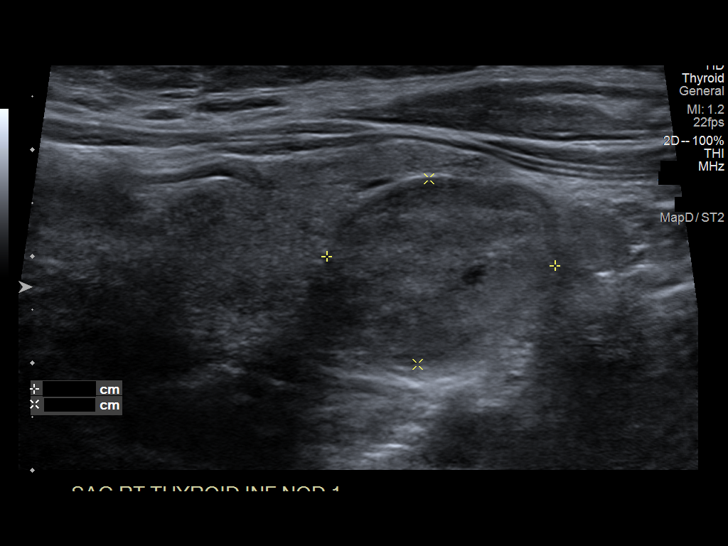
[im 43/73]
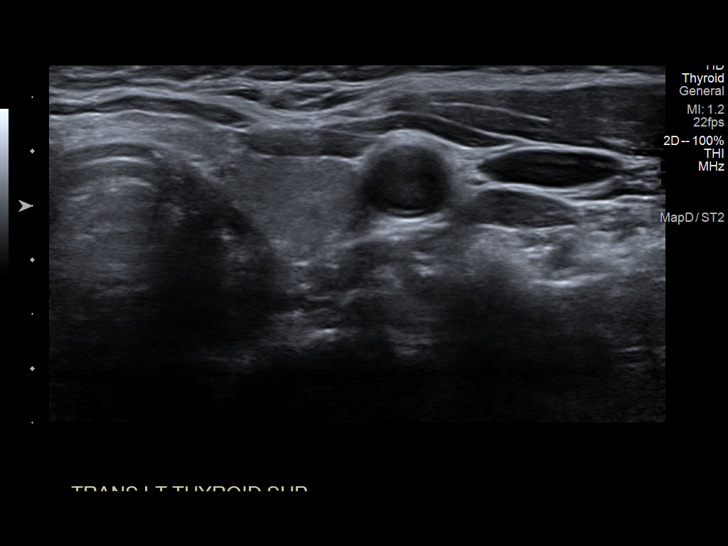
[im 49/73]
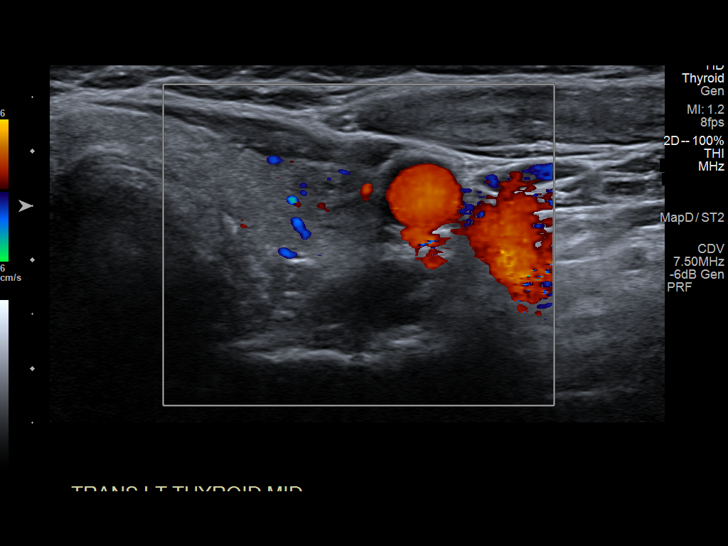
[im 55/73]
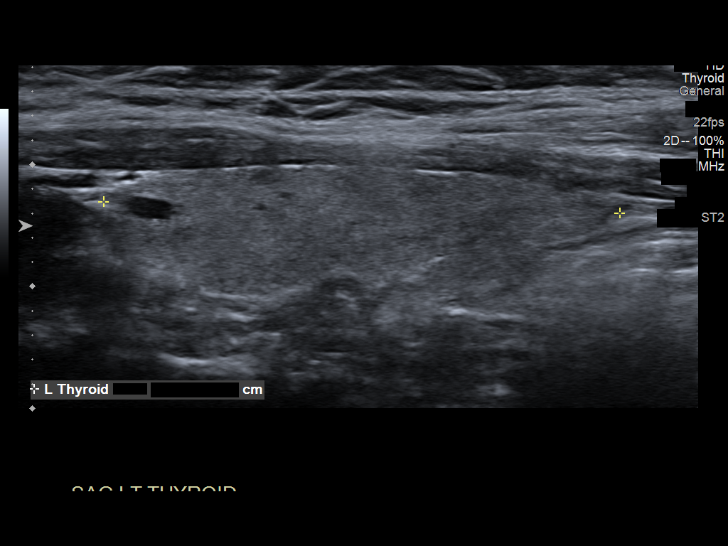
[im 61/73]
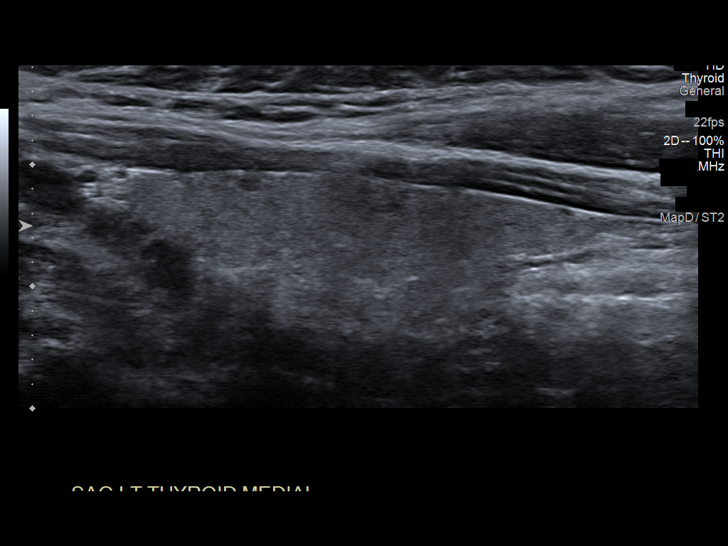
[im 67/73]
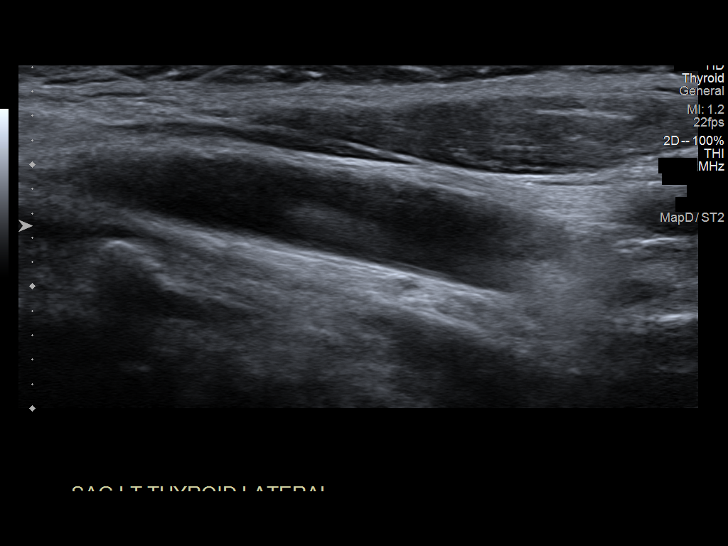
[im 73/73]
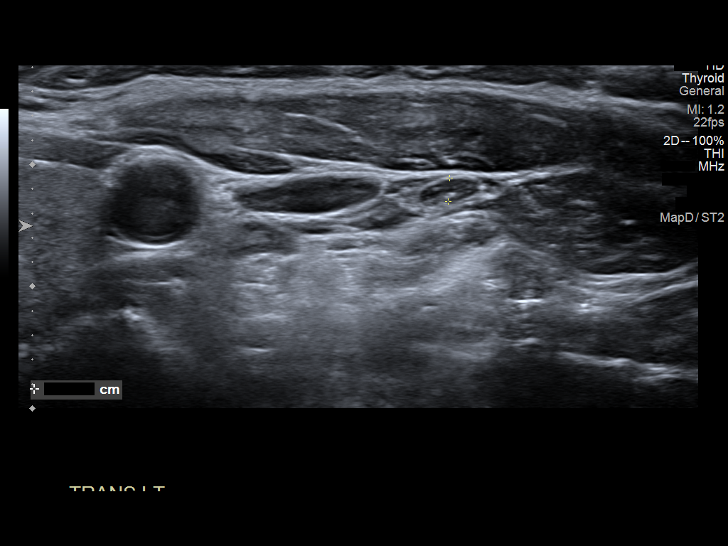

[13 of 25 positions shown; findings below may reference images not displayed]

FINDINGS: Parenchymal Echotexture: Mildly heterogenous

Isthmus: 0.3 cm

Right lobe: 4.8 x 1.7 x 2.0 cm

Left lobe: 4.2 x 1.4 x 1.4 cm

_________________________________________________________

Estimated total number of nodules >/= 1 cm: 2

Number of spongiform nodules >/=  2 cm not described below (TR1): 0

Number of mixed cystic and solid nodules >/= 1.5 cm not described
below (TR2): 0

_________________________________________________________

Nodule labeled 1 is a solid nodule in the inferior right thyroid
lobe measuring up to 2.3 cm, previously 2.4 cm. This nodule was
previously biopsied. It remains similar in size and morphology.

Nodule labeled 2 is a solid isoechoic TR 3 nodule with ill-defined
margins that measures 1.3 x 0.7 x 0.4 cm, previously 1.3 cm. Given
size (<1.4 cm) and appearance, this nodule does NOT meet TI-RADS
criteria for biopsy or dedicated follow-up.
IMPRESSION: Multinodular thyroid gland. No new or enlarging thyroid nodules.
Nodule labeled 1 was previously biopsied, and remains similar in
size and morphology. Correlate with biopsy results.

The above is in keeping with the ACR TI-RADS recommendations - [HOSPITAL] 5015;[DATE].

## 2024-02-03 ENCOUNTER — Other Ambulatory Visit: Payer: Self-pay

## 2024-02-03 DIAGNOSIS — Z87891 Personal history of nicotine dependence: Secondary | ICD-10-CM

## 2024-02-03 DIAGNOSIS — Z122 Encounter for screening for malignant neoplasm of respiratory organs: Secondary | ICD-10-CM

## 2024-02-18 ENCOUNTER — Ambulatory Visit (INDEPENDENT_AMBULATORY_CARE_PROVIDER_SITE_OTHER): Admitting: Pulmonary Disease

## 2024-02-18 ENCOUNTER — Encounter: Payer: Self-pay | Admitting: Pulmonary Disease

## 2024-02-18 VITALS — BP 136/90 | HR 89 | Temp 97.9°F | Ht 62.0 in | Wt 217.8 lb

## 2024-02-18 DIAGNOSIS — Z6834 Body mass index (BMI) 34.0-34.9, adult: Secondary | ICD-10-CM

## 2024-02-18 DIAGNOSIS — R0602 Shortness of breath: Secondary | ICD-10-CM | POA: Diagnosis not present

## 2024-02-18 DIAGNOSIS — K589 Irritable bowel syndrome without diarrhea: Secondary | ICD-10-CM

## 2024-02-18 DIAGNOSIS — Z87891 Personal history of nicotine dependence: Secondary | ICD-10-CM

## 2024-02-18 DIAGNOSIS — J4489 Other specified chronic obstructive pulmonary disease: Secondary | ICD-10-CM | POA: Diagnosis not present

## 2024-02-18 MED ORDER — BREZTRI AEROSPHERE 160-9-4.8 MCG/ACT IN AERO: 2.0000 | INHALATION_SPRAY | Freq: Two times a day (BID) | RESPIRATORY_TRACT | Status: DC

## 2024-02-18 NOTE — Patient Instructions (Signed)
 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 We also discussed your challenges with obtaining your usual inhaler medication.  YOUR PLAN:  -ASTHMA-COPD OVERLAP SYNDROME: This is a chronic respiratory condition that includes features of both asthma and chronic obstructive pulmonary disease (COPD), causing shortness of breath. Your echocardiogram showed mild left ventricular stiffness, which is common with aging. Since you are having trouble getting your usual medication, we provided you with samples of Breztri . Please use two puffs twice daily. You do not need refills for albuterol  at this time.  -MORBID OBESITY: Your weight is contributing to your respiratory issues, including shortness of breath. Managing your weight can help improve your breathing and overall health.  -IRRITABLE BOWEL SYNDROME (IBS): IBS is a condition that affects your digestive system, causing symptoms like bloating and discomfort. You are currently under the care of a gastroenterologist for this condition. Please continue to follow their recommendations to manage your symptoms.  INSTRUCTIONS:  Please follow up with your gastroenterologist for your IBS as needed. Continue using the Breztri  inhaler as directed, and monitor your symptoms. If you have any issues or your symptoms worsen, please contact our office.

## 2024-02-18 NOTE — Progress Notes (Signed)
 Subjective:    Patient ID: Angelica Zhang, female    DOB: 24-Apr-1956, 68 y.o.   MRN: [id number]  Patient Care Team: Lorel Maxie LABOR, MD as PCP - General (Family Medicine) Tamea Dedra CROME, MD as Consulting Physician (Pulmonary Disease)  Chief Complaint  Patient presents with   Follow-up    Shortness of breath on exertion. Occasional cough.     BACKGROUND/INTERVAL:Patient is a 68 year old former smoker with a 40-pack-year history of smoking and a history as noted below who presents for follow-up on the issue of dyspnea on exertion wheezing and cough. She has mild persistent asthma, morbid obesity and obstructive sleep apnea but unable to wear CPAP.  Patient was last seen on 18 Dec 2023.  HPI Discussed the use of AI scribe software for clinical note transcription with the patient, who gave verbal consent to proceed.  History of Present Illness   Angelica Zhang is a 68 year old female with asthma, COPD, overlap syndrome, and morbid obesity who presents with shortness of breath.  She experiences persistent shortness of breath. Her symptoms have remained stable, with no significant change. She has been using an inhaler but has encountered issues obtaining her medication. She discussed obtaining a generic version of Advair, but it was unavailable at the pharmacy.  Also the generic version is not available an HFA form which is what the patient tolerates.  She has used this medication for years and is frustrated with the current situation regarding her prescriptions.  She reports a recent exacerbation of irritable bowel syndrome (IBS), describing significant bloating and discomfort over the past week. She is under the care of a gastroenterologist for this condition and is struggling to manage her symptoms.  She recently had an echocardiogram in May. She visited her cardiologist earlier today for further evaluation.  She has grade 1 diastolic dysfunction and LVEF of 55% trivial aortic, mitral,  pulmonic and tricuspid regurgitation study was performed [facility].     DATA 08/26/2022 PFTs: FEV1 1.62 L or 72% predicted, FVC 2.05 L or 70% predicted, FEV1/FVC 79%.  There was significant bronchodilator response indicated by decreased airway resistance.  Decreased ERV consistent with obesity.  Otherwise lung volumes were normal.  Study is consistent with minimal obstructive airways disease of the asthmatic type and minimal restriction due to obesity. 12/18/2023 nitric oxide : 9 ppb, no evidence of type II inflammation. 01/01/2024 echocardiogram [facility]): Normal LVEF, no LVH.  Estimated EF 55%.  Normal LA pressures with diastolic dysfunction grade 1.  Normal right ventricular systolic function, trivial aortic, mitral, pulmonic and tricuspid regurgitation no valvular stenosis.   Review of Systems A 10 point review of systems was performed and it is as noted above otherwise negative.   Patient Active Problem List   Diagnosis Date Noted   Sick sinus syndrome (HCC) 09/03/2016   Bradycardia 09/20/2012   Dizziness 09/20/2012   Morbid obesity (HCC) 09/20/2012   Smoking history 09/20/2012    Social History   Tobacco Use   Smoking status: Former    Current packs/day: 0.00    Average packs/day: 1 pack/day for 40.0 years (40.0 ttl pk-yrs)    Types: Cigarettes, E-cigarettes    Start date: 1976    Quit date: 2016    Years since quitting: 9.5   Smokeless tobacco: Never  Substance Use Topics   Alcohol use: No    Allergies  Allergen Reactions   Meloxicam  Nausea And Vomiting and Nausea Only   Doxycycline Other (See Comments)  Hair loss, Made her feel bad   Linaclotide Other (See Comments)    Other reaction(s): Other (See Comments)  Flushing    Current Meds  Medication Sig   albuterol  (PROVENTIL ) (2.5 MG/3ML) 0.083% nebulizer solution Take 3 mLs (2.5 mg total) by nebulization every 4 (four) hours as needed for wheezing or shortness of breath.   albuterol  (VENTOLIN  HFA) 108 (90  Base) MCG/ACT inhaler TAKE 2 PUFFS BY MOUTH EVERY 6 HOURS AS NEEDED FOR WHEEZE OR SHORTNESS OF BREATH   Aspirin 81 MG CAPS Take 1 tablet by mouth daily.   budesonide -glycopyrrolate-formoterol  (BREZTRI  AEROSPHERE) 160-9-4.8 MCG/ACT AERO inhaler Inhale 2 puffs into the lungs in the morning and at bedtime.   cetirizine (ZYRTEC) 10 MG tablet Take 10 mg by mouth at bedtime.   cholecalciferol (VITAMIN D) 1000 units tablet Take 1,000 Units by mouth at bedtime.   fluticasone  (FLONASE) 50 MCG/ACT nasal spray Place 2 sprays into both nostrils daily.   montelukast  (SINGULAIR ) 10 MG tablet Take 10 mg by mouth at bedtime.   polyethylene glycol powder (GLYCOLAX/MIRALAX) powder Take 17 g by mouth daily as needed for constipation.   [DISCONTINUED] SYMBICORT  160-4.5 MCG/ACT inhaler Inhale 2 puffs into the lungs daily in the afternoon.    Immunization History  Administered Date(s) Administered   Influenza-Unspecified 05/22/2010, 07/09/2011, 06/13/2013, 08/02/2014, 05/17/2015, 10/16/2016, 06/10/2017, 07/14/2018, 06/28/2019, 05/07/2020, 05/19/2022   Tdap 03/22/2020, 01/31/2023        Objective:     BP (!) 136/90 (BP Location: Left Arm, Patient Position: Sitting, Cuff Size: Normal)   Pulse 89   Temp 97.9 F (36.6 C) (Oral)   Ht 5' 2 (1.575 m)   Wt 217 lb 12.8 oz (98.8 kg)   SpO2 96%   BMI 39.84 kg/m   SpO2: 96 %  GENERAL: Obese woman, no acute distress, fully ambulatory, no conversational dyspnea. HEAD: Normocephalic, atraumatic.  EYES: Pupils equal, round, reactive to light.  No scleral icterus.  MOUTH: Dentition intact, oral mucosa moist.  No thrush. NECK: Supple. No thyromegaly. Trachea midline. No JVD.  No adenopathy. PULMONARY: Good air entry bilaterally.  No adventitious sounds. CARDIOVASCULAR: S1 and S2. Regular rate and rhythm.  No rubs, murmurs or gallops heard.  Pacemaker in situ. ABDOMEN: Significant truncal obesity.  Otherwise benign. MUSCULOSKELETAL: No joint deformity, no  clubbing, no edema.  NEUROLOGIC: No overt focal deficit, no gait disturbance, speech is fluent. SKIN: Intact,warm,dry. PSYCH: Mood and behavior normal.    Assessment & Plan:     ICD-10-CM   1. Mild persistent asthma without complication  J45.30     2. SOB (shortness of breath)  R06.02     3. Morbid obesity (HCC)  E66.01     4. Former heavy cigarette smoker (20-39 per day)  Z87.891      Meds ordered this encounter  Medications   budesonide -glycopyrrolate-formoterol  (BREZTRI  AEROSPHERE) 160-9-4.8 MCG/ACT AERO inhaler    Sig: Inhale 2 puffs into the lungs in the morning and at bedtime.    Dispense:  2 each    Lot Number?:   [id number]    Expiration Date?:   05/11/2026    Manufacturer?:   [employer] [[id number]]    NDC:   9689-5383-71 [[id number]]    Quantity:   2   Discussion:    Asthma-COPD Overlap Syndrome Chronic respiratory condition with features of both asthma and COPD, presenting with shortness of breath. Echocardiogram indicates mild left ventricular stiffness, grade 1 diastolic dysfunction. Current inhaler therapy is suboptimal due to insurance issues with Advair;  attempted switch to generic Napoleon was unsuccessful. Lungs are clear on examination. - Provide Breztri  samples, instruct to use two puffs twice daily - No refills needed for albuterol   Morbid Obesity Contributing factor to respiratory issues, including shortness of breath.  Irritable Bowel Syndrome (IBS) Reports severe IBS with bloating and discomfort. Under gastroenterologist care.     Advised if symptoms do not improve or worsen, to please contact office for sooner follow up or seek emergency care.    I spent 35 minutes of dedicated to the care of this patient on the date of this encounter to include pre-visit review of records, face-to-face time with the patient discussing conditions above, post visit ordering of testing, clinical documentation with the electronic health record, making appropriate referrals as  documented, and communicating necessary findings to members of the patients care team.     C. Leita Sanders, MD Advanced Bronchoscopy P[facility]    *This note was generated using voice recognition software/Dragon and/or AI transcription program.  Despite best efforts to proofread, errors can occur which can change the meaning. Any transcriptional errors that result from this process are unintentional and may not be fully corrected at the time of dictation.

## 2024-03-08 ENCOUNTER — Ambulatory Visit (INDEPENDENT_AMBULATORY_CARE_PROVIDER_SITE_OTHER): Admitting: Podiatry

## 2024-03-08 ENCOUNTER — Ambulatory Visit (INDEPENDENT_AMBULATORY_CARE_PROVIDER_SITE_OTHER)

## 2024-03-08 ENCOUNTER — Encounter: Payer: Self-pay | Admitting: Podiatry

## 2024-03-08 VITALS — Ht 62.0 in | Wt 217.8 lb

## 2024-03-08 DIAGNOSIS — M7752 Other enthesopathy of left foot: Secondary | ICD-10-CM

## 2024-03-08 DIAGNOSIS — L600 Ingrowing nail: Secondary | ICD-10-CM

## 2024-03-08 DIAGNOSIS — S8251XA Displaced fracture of medial malleolus of right tibia, initial encounter for closed fracture: Secondary | ICD-10-CM | POA: Diagnosis not present

## 2024-03-08 NOTE — Patient Instructions (Signed)

## 2024-03-08 NOTE — Progress Notes (Signed)
 Chief Complaint  Patient presents with   Toe Pain    Pt is here due to left pinky toe, she states that she has been having pain in the toe for quite some time was seen here before for this issue in January and received an injection, she states that she believes it's the nail, when she recently cut the toenail down felt some relief.    HPI: 68 y.o. female presenting today for 2 separate complaints:  First the patient states that she continues to have pain and tenderness associated to the left fifth digit.  She believes that the majority of the pain is associated to her toenail plate.  There is pain when pressure is applied to the toenail.  The patient also states that about 1 month ago she injured the medial aspect of her right ankle while shopping at the grocery store.  She noticed bruising and swelling to the area with severe pain.  Over the past month the pain has improved but she continues to have tenderness to this area and to have it evaluated  Past Medical History:  Diagnosis Date   Acute bronchitis    Acute upper respiratory infections of unspecified site    Anxiety    Asthma    Backache, unspecified    Bradycardia    Chronic airway obstruction, not elsewhere classified    Depressive disorder, not elsewhere classified    Hypotension, unspecified    Osteoarthrosis, unspecified whether generalized or localized, other specified sites    Other and unspecified hyperlipidemia    Screening for diabetes mellitus    Sinusitis    Umbilical hernia without mention of obstruction or gangrene    Unspecified hypertrophic and atrophic condition of skin    Unspecified local infection of skin and subcutaneous tissue    Urinary calculus, unspecified     Past Surgical History:  Procedure Laterality Date   BACK SURGERY     BREAST BIOPSY Left 01/07/2017   Affirm Bx- neg   CARDIAC CATHETERIZATION  09/20/2009   [facility];[place]   CHOLECYSTECTOMY     COLONOSCOPY WITH PROPOFOL  N/A 04/23/2017    Procedure: COLONOSCOPY WITH PROPOFOL ;  Surgeon: Gaylyn Gladis PENNER, MD;  Location: [facility] ENDOSCOPY;  Service: Endoscopy;  Laterality: N/A;   COLONOSCOPY WITH PROPOFOL  N/A 06/10/2021   Procedure: COLONOSCOPY WITH PROPOFOL ;  Surgeon: Maryruth Ole DASEN, MD;  Location: [facility] ENDOSCOPY;  Service: Endoscopy;  Laterality: N/A;   HERNIA REPAIR     x 2   PACEMAKER INSERTION N/A 09/03/2016   Procedure: INSERTION PACEMAKER;  Surgeon: Marsa Dooms, MD;  Location: [facility] ORS;  Service: Cardiovascular;  Laterality: N/A;   ROTATOR CUFF REPAIR      Allergies  Allergen Reactions   Meloxicam  Nausea And Vomiting and Nausea Only   Doxycycline Other (See Comments)    Hair loss, Made her feel bad   Linaclotide Other (See Comments)    Other reaction(s): Other (See Comments)  Flushing     Physical Exam: General: The patient is alert and oriented x3 in no acute distress.  Dermatology: Evidence of an ingrowing toenail noted to the fifth digit of the left foot with associated tenderness  Vascular: Palpable pedal pulses bilaterally. Capillary refill within normal limits.  No appreciable edema.  No erythema.  Neurological: Grossly intact via light touch  Musculoskeletal Exam: Associated tenderness to palpation around the medial malleolus of the right ankle  Radiographic Exam RT ankle 03/08/2024:  Normal osseous mineralization. Joint spaces preserved.  Slight irregularity noted to  the distal tip of the medial malleolus of the right ankle.  On lateral view there does appear to be a small fragment  Assessment/Plan of Care: 1.  Fracture of medial malleolus right ankle; closed, minimally displaced, initial encounter; DOI around 02/09/24 -X-rays reviewed -Injury was about 1 month ago.  Since that time the pain has improved somewhat -Conservative treatment for now.  Will simply observe.  I do not see any benefit of placing the patient in a cam boot or cam walker.  Is a small fragment and I do not believe surgery  is warranted either -Will observe over the next 2 months.  In the meantime continue good supportive tennis shoes and sneakers   2.  Chronic ingrown toenail fifth digit left foot -Today we discussed different treatment options for the patient.  She would like to have the nail avulsed completely and permanently.  She says that she believes the pain from her pinky toenail is associated to the nail plate.  Nail avulsion procedure was discussed in detail.  She consents would like to proceed -The toe was prepped in aseptic manner and digital block performed using 3 mL of 2% lidocaine  plain.  The nail was avulsed in its entirety followed by 3 x 30-second application of phenol and alcohol flush.  Dressings applied -Post care instructions provided -Return to clinic 3 weeks        Thresa EMERSON Sar, DPM [facility]  Dr. Thresa EMERSON Sar, DPM    [address] [place], [place] [address]                Office [phone]  Fax [phone]

## 2024-03-16 ENCOUNTER — Telehealth: Payer: Self-pay | Admitting: Podiatry

## 2024-03-16 NOTE — Telephone Encounter (Signed)
 Patient called stating she had an ingrown toe nail removed last week. She is stating she feels it is getting infected.

## 2024-03-16 NOTE — Telephone Encounter (Signed)
 Spoke with patient concerned about toe infection advised to schedule appointment to evaluate.

## 2024-03-18 ENCOUNTER — Ambulatory Visit (INDEPENDENT_AMBULATORY_CARE_PROVIDER_SITE_OTHER): Admitting: Podiatry

## 2024-03-18 ENCOUNTER — Encounter: Payer: Self-pay | Admitting: Podiatry

## 2024-03-18 VITALS — Ht 62.0 in | Wt 217.8 lb

## 2024-03-18 DIAGNOSIS — S8251XA Displaced fracture of medial malleolus of right tibia, initial encounter for closed fracture: Secondary | ICD-10-CM | POA: Diagnosis not present

## 2024-03-18 MED ORDER — AMOXICILLIN-POT CLAVULANATE 875-125 MG PO TABS: 1.0000 | ORAL_TABLET | Freq: Two times a day (BID) | ORAL | 0 refills | Status: AC

## 2024-03-18 NOTE — Progress Notes (Signed)
   Chief Complaint  Patient presents with   Ingrown Toenail    Pt is here due to possible infection on left pinky toenail, she states that she did everything as told toe is very red and swollen an hurts.    Subjective: 68 y.o. female presents today status post permanent nail avulsion procedure of the fifth digit of the left foot that was performed on 03/08/2024.  She is having some burning sensation and noticed some continued drainage to the area and was concerned.  She presents for further treatment evaluation  Last visit she was also diagnosed with ankle fracture to the medial malleolus of the right tibia.  We are treating it conservatively.  Currently she is WBAT in good supportive shoes without any complication  Past Medical History:  Diagnosis Date   Acute bronchitis    Acute upper respiratory infections of unspecified site    Anxiety    Asthma    Backache, unspecified    Bradycardia    Chronic airway obstruction, not elsewhere classified    Depressive disorder, not elsewhere classified    Hypotension, unspecified    Osteoarthrosis, unspecified whether generalized or localized, other specified sites    Other and unspecified hyperlipidemia    Screening for diabetes mellitus    Sinusitis    Umbilical hernia without mention of obstruction or gangrene    Unspecified hypertrophic and atrophic condition of skin    Unspecified local infection of skin and subcutaneous tissue    Urinary calculus, unspecified     Objective: Neurovascular status intact.  Skin is warm, dry and supple.  Nailbed and surrounding tissue appears to be healing appropriately.  There is some mild localized erythema around the nail matricectomy site but this is likely due to the phenol reaction  Right ankle stable.  She is currently ambulating in tennis shoes without pain  Assessment: #1 s/p total permanent nail matrixectomy fifth digit left #2 closed minimally displaced ankle fracture medial malleolus  right  Plan of care: -Patient was evaluated  -Patient is very concerned for the redness around the toe although I did explain that it is likely secondary to phenol reaction.  Prescription for Augmentin  875/125 mg x 7 days pharmacy to resolve any possibility of infection.  The patient felt better about this -Continue Epsom salt soaks and triple antibiotic with a Band-Aid -Recommend follow-up in about 1 month for right ankle x-rays   Thresa EMERSON Sar, DPM [facility]  Dr. Thresa EMERSON Sar, DPM    [address] [place], [place] [address]                Office [phone]  Fax [phone]

## 2024-03-29 ENCOUNTER — Ambulatory Visit: Admitting: Podiatry

## 2024-04-15 ENCOUNTER — Ambulatory Visit

## 2024-04-15 ENCOUNTER — Encounter: Payer: Self-pay | Admitting: Podiatry

## 2024-04-15 ENCOUNTER — Ambulatory Visit: Admitting: Podiatry

## 2024-04-15 VITALS — Ht 62.0 in | Wt 217.8 lb

## 2024-04-15 DIAGNOSIS — L600 Ingrowing nail: Secondary | ICD-10-CM

## 2024-04-15 DIAGNOSIS — S8251XA Displaced fracture of medial malleolus of right tibia, initial encounter for closed fracture: Secondary | ICD-10-CM

## 2024-04-15 DIAGNOSIS — M19072 Primary osteoarthritis, left ankle and foot: Secondary | ICD-10-CM

## 2024-04-15 NOTE — Progress Notes (Signed)
   Chief Complaint  Patient presents with   Nail Problem    Pt is here to f/u on left pinky toe after having nail     Subjective: 68 y.o. female presents today status post permanent nail avulsion procedure of the fifth digit of the left foot that was performed on 03/08/2024.  Significant improvement.  She only has very slight sensitivity to the toe  Last visit she was also diagnosed with ankle fracture to the medial malleolus of the right tibia.  We are treating it conservatively.  Overall she says the medial aspect of the ankle feels significantly better  Past Medical History:  Diagnosis Date   Acute bronchitis    Acute upper respiratory infections of unspecified site    Anxiety    Asthma    Backache, unspecified    Bradycardia    Chronic airway obstruction, not elsewhere classified    Depressive disorder, not elsewhere classified    Hypotension, unspecified    Osteoarthrosis, unspecified whether generalized or localized, other specified sites    Other and unspecified hyperlipidemia    Screening for diabetes mellitus    Sinusitis    Umbilical hernia without mention of obstruction or gangrene    Unspecified hypertrophic and atrophic condition of skin    Unspecified local infection of skin and subcutaneous tissue    Urinary calculus, unspecified     Objective: Neurovascular status intact.  The total permanent nail matricectomy to the left fifth toe has healed nicely.  No drainage.  No erythema  Right ankle stable.  She is currently ambulating in tennis shoes without pain  Radiographic exam RT ankle 04/15/2024: Unchanged.  Stable.  Radiographic exam LT foot 04/15/2024: No acute fractures identified.  No erosions concerning for underlying infection.  Impression: Negative  Assessment: #1 s/p total permanent nail matrixectomy fifth digit left; healed #2 closed minimally displaced ankle fracture medial malleolus right; resolved  Plan of care: -Patient was evaluated x-rays  reviewed -Patient has been experiencing electrical shooting numbness pain with intermittent leg cramping to the bilateral lower extremities.  She has a long history of spinal stenosis and fusion -Recommend that she follows up with her neuro spine surgeon at [facility] -Return to clinic PRN   Thresa EMERSON Sar, DPM [facility]  Dr. Thresa EMERSON Sar, DPM    [address] [place], [place] [address]                Office [phone]  Fax [phone]

## 2024-05-20 ENCOUNTER — Ambulatory Visit: Admitting: Pulmonary Disease

## 2024-08-01 ENCOUNTER — Other Ambulatory Visit: Payer: Self-pay | Admitting: Pulmonary Disease

## 2024-09-01 ENCOUNTER — Other Ambulatory Visit: Payer: Self-pay | Admitting: Family Medicine

## 2024-09-01 DIAGNOSIS — Z780 Asymptomatic menopausal state: Secondary | ICD-10-CM

## 2024-10-04 ENCOUNTER — Other Ambulatory Visit

## 2024-10-04 ENCOUNTER — Encounter
